# Patient Record
Sex: Male | Born: 1985 | Race: White | Hispanic: No | Marital: Single | Smoking: Former smoker
Health system: Southern US, Community
[De-identification: ages and names within clinical notes are randomized; demographics above are authoritative.]

## PROBLEM LIST (undated history)

## (undated) DIAGNOSIS — J45909 Unspecified asthma, uncomplicated: Secondary | ICD-10-CM

## (undated) DIAGNOSIS — S42302A Unspecified fracture of shaft of humerus, left arm, initial encounter for closed fracture: Secondary | ICD-10-CM

## (undated) HISTORY — DX: Unspecified fracture of shaft of humerus, left arm, initial encounter for closed fracture: S42.302A

---
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
## 2007-09-03 DIAGNOSIS — S42302A Unspecified fracture of shaft of humerus, left arm, initial encounter for closed fracture: Secondary | ICD-10-CM

## 2007-09-03 HISTORY — PX: OTHER SURGICAL HISTORY: SHX169

## 2007-09-03 HISTORY — DX: Unspecified fracture of shaft of humerus, left arm, initial encounter for closed fracture: S42.302A

## 2007-11-29 ENCOUNTER — Emergency Department: Payer: Self-pay | Admitting: Emergency Medicine

## 2008-06-12 ENCOUNTER — Emergency Department: Payer: Self-pay | Admitting: Emergency Medicine

## 2008-06-21 ENCOUNTER — Ambulatory Visit: Payer: Self-pay | Admitting: Orthopedic Surgery

## 2008-06-27 ENCOUNTER — Ambulatory Visit: Payer: Self-pay | Admitting: Orthopedic Surgery

## 2009-04-25 ENCOUNTER — Ambulatory Visit: Payer: Self-pay | Admitting: Orthopedic Surgery

## 2012-08-16 ENCOUNTER — Emergency Department: Payer: Self-pay | Admitting: Emergency Medicine

## 2014-03-06 IMAGING — CR DG CHEST 2V
1 series · 2 of 2 positions shown · non-contrast
Comparison: none

REASON FOR EXAM: productive cough
COMMENTS:

PROCEDURE:     DXR - DXR CHEST PA (OR AP) AND LATERAL  - August 16, 2012  [DATE]
RESULT:     Comparison: None

[Series 1: w chest pa · 0.14mm/px · 2 of 2 slices shown]
[im 1/2]
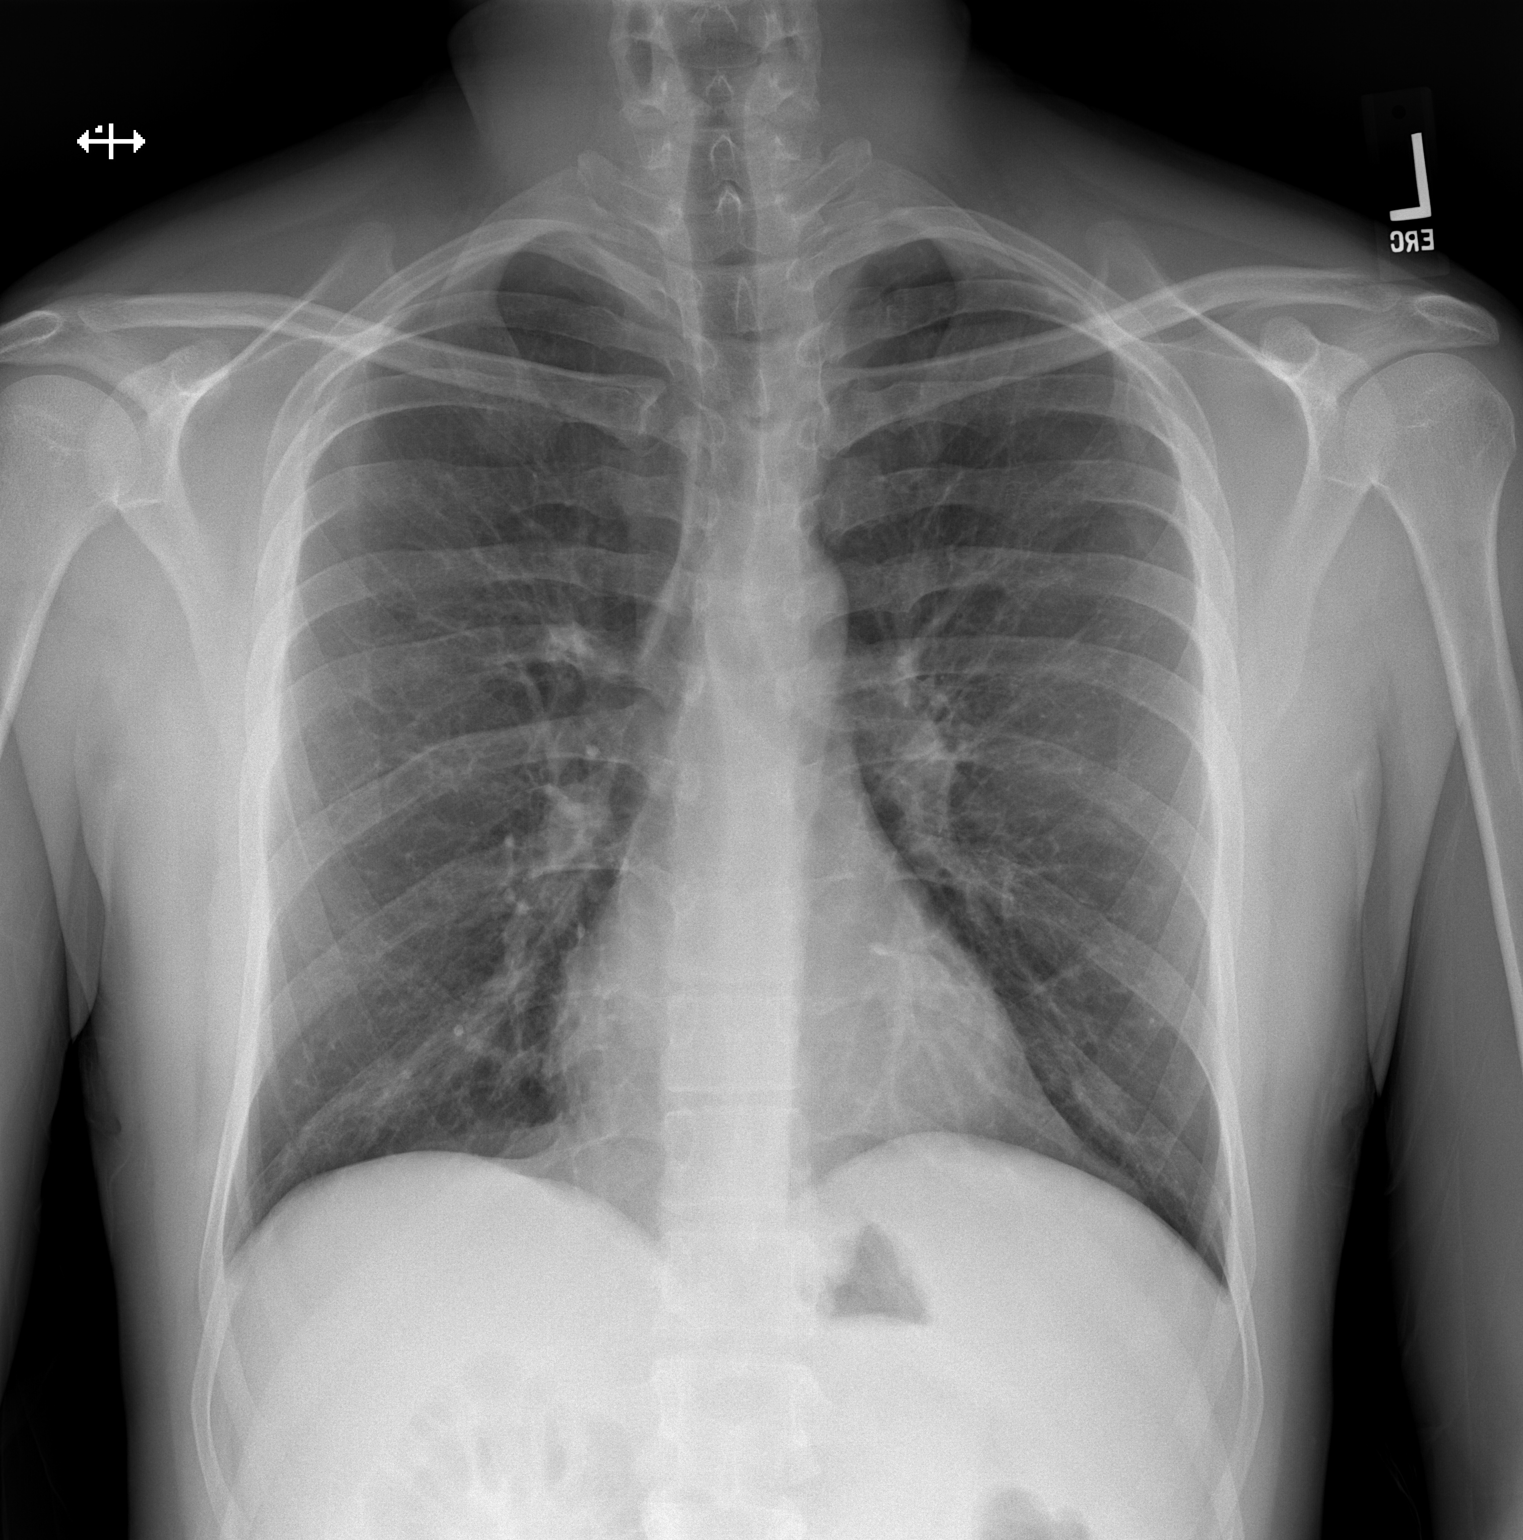
[im 2/2]
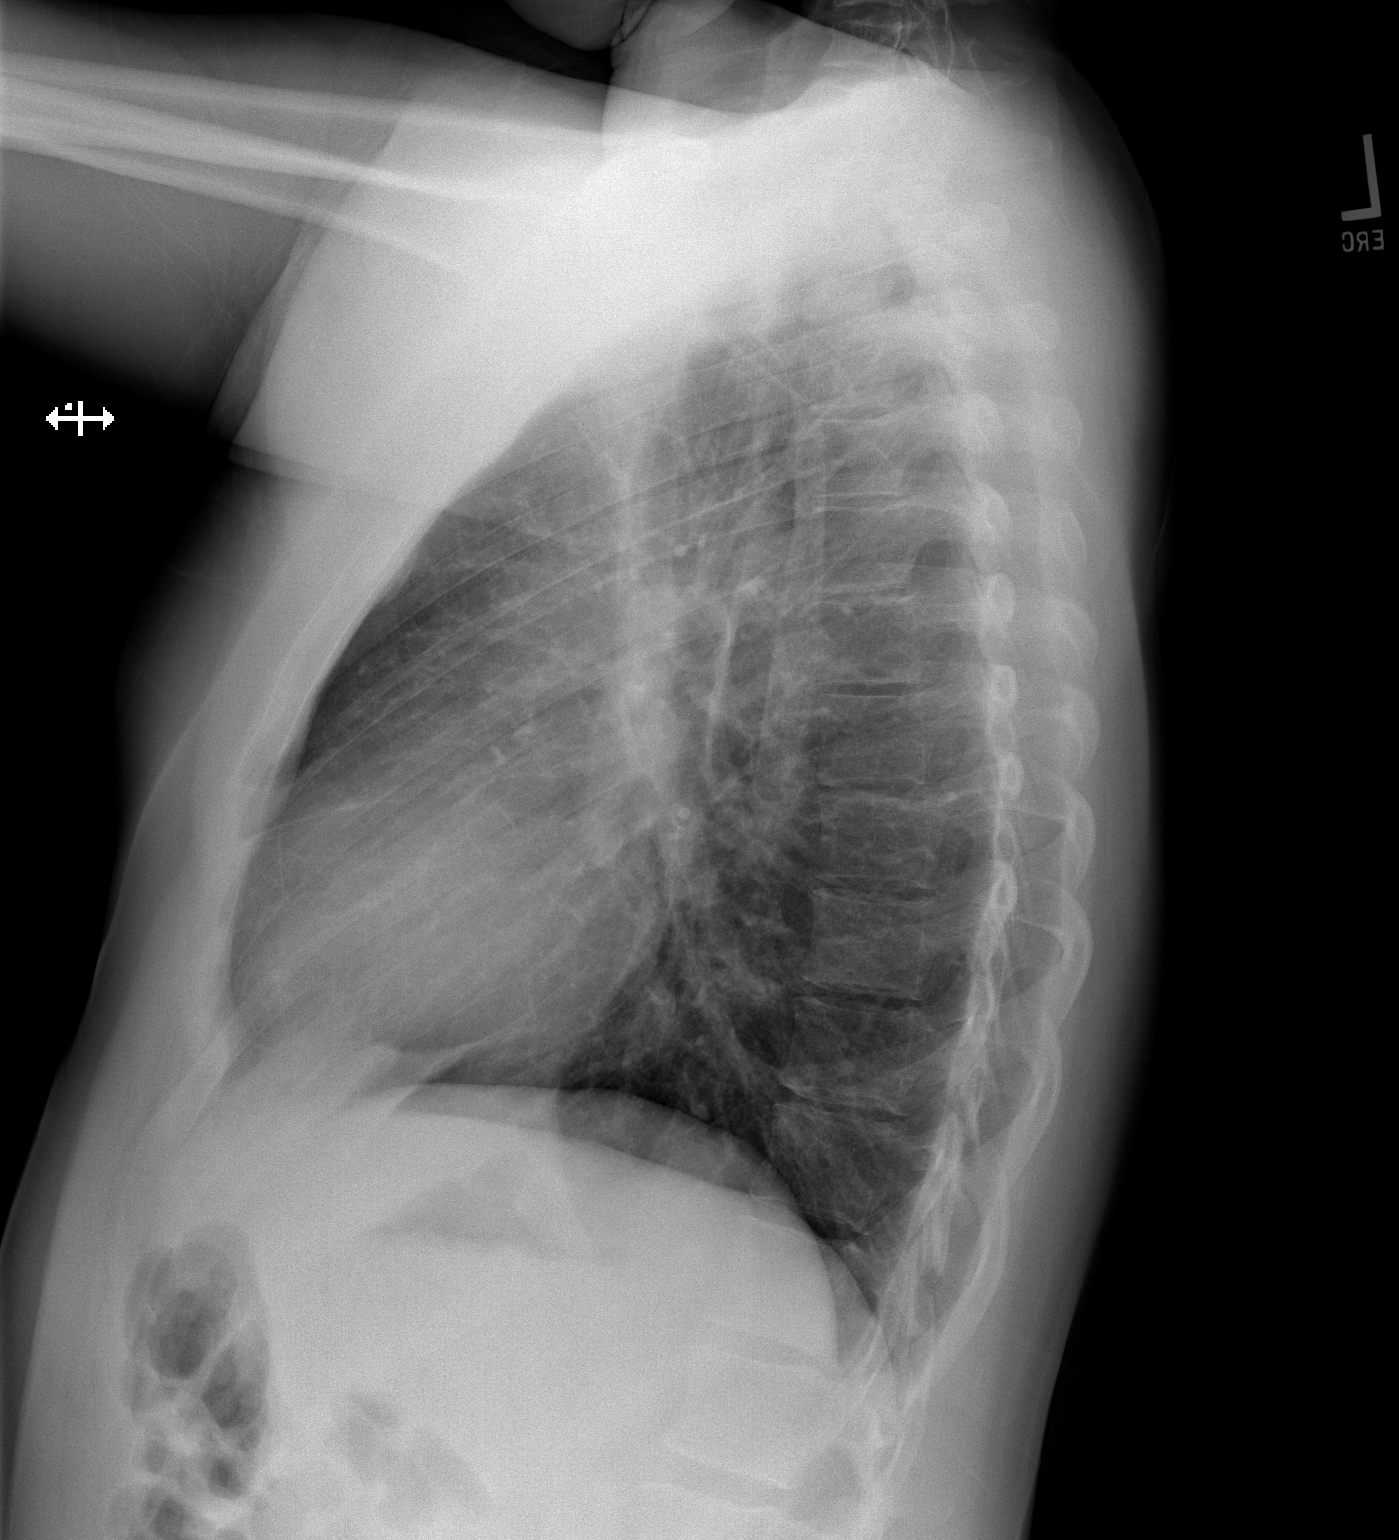

[2 of 2 positions shown; findings below may reference images not displayed]

FINDINGS: PA and lateral chest radiographs are provided.  There is no focal
parenchymal opacity, pleural effusion, or pneumothorax. The heart and
mediastinum are unremarkable.  The osseous structures are unremarkable.
IMPRESSION: No acute disease of the che[REDACTED]

## 2017-10-15 ENCOUNTER — Encounter: Payer: Self-pay | Admitting: Physician Assistant

## 2017-10-15 ENCOUNTER — Ambulatory Visit (INDEPENDENT_AMBULATORY_CARE_PROVIDER_SITE_OTHER): Payer: Commercial Managed Care - PPO | Admitting: Physician Assistant

## 2017-10-15 VITALS — BP 126/84 | HR 84 | Temp 97.8°F | Resp 16 | Ht 68.5 in | Wt 172.0 lb

## 2017-10-15 DIAGNOSIS — Z130 Encounter for screening for diseases of the blood and blood-forming organs and certain disorders involving the immune mechanism: Secondary | ICD-10-CM | POA: Diagnosis not present

## 2017-10-15 DIAGNOSIS — Z1329 Encounter for screening for other suspected endocrine disorder: Secondary | ICD-10-CM

## 2017-10-15 DIAGNOSIS — Z131 Encounter for screening for diabetes mellitus: Secondary | ICD-10-CM | POA: Diagnosis not present

## 2017-10-15 DIAGNOSIS — Z23 Encounter for immunization: Secondary | ICD-10-CM | POA: Diagnosis not present

## 2017-10-15 DIAGNOSIS — J45909 Unspecified asthma, uncomplicated: Secondary | ICD-10-CM

## 2017-10-15 DIAGNOSIS — Z13220 Encounter for screening for lipoid disorders: Secondary | ICD-10-CM

## 2017-10-15 DIAGNOSIS — J309 Allergic rhinitis, unspecified: Secondary | ICD-10-CM | POA: Insufficient documentation

## 2017-10-15 DIAGNOSIS — Z720 Tobacco use: Secondary | ICD-10-CM | POA: Diagnosis not present

## 2017-10-15 DIAGNOSIS — Z114 Encounter for screening for human immunodeficiency virus [HIV]: Secondary | ICD-10-CM | POA: Diagnosis not present

## 2017-10-15 MED ORDER — FLUTICASONE FUROATE-VILANTEROL 100-25 MCG/INH IN AEPB: 1.0000 | INHALATION_SPRAY | Freq: Every day | RESPIRATORY_TRACT | 1 refills | Status: DC

## 2017-10-15 MED ORDER — ALBUTEROL SULFATE HFA 108 (90 BASE) MCG/ACT IN AERS: 2.0000 | INHALATION_SPRAY | Freq: Four times a day (QID) | RESPIRATORY_TRACT | 6 refills | Status: DC | PRN

## 2017-10-15 NOTE — Progress Notes (Signed)
Patient: Kyle Obrien Male    DOB: 12-Sep-1985   32 y.o.   MRN: [id number] Visit Date: 10/15/2017  Today's Provider: Trey Sailors, PA-C   Chief Complaint  Patient presents with  . Establish Care   Subjective:    HPI   Kyle Obrien is a 31 y/o man presenting today to establish care, previously seen at [facility].Living in [place], by himself. Works full time as a Naval architect. Has 51 year old son with asthma.  He also has a history of asthma. Was previously on Qvar which did not help. Currently using rescue inhaler every other day. Currently smoking one pack per day, not interested in cessation aids.   No other health issues. Due for Tdap today.    Allergies  Allergen Reactions  . Penicillin G Rash    Pt states allergic reaction as infant     Current Outpatient Medications:  .  albuterol (PROAIR HFA) 108 (90 Base) MCG/ACT inhaler, Inhale 2 puffs into the lungs every 6 (six) hours as needed for wheezing or shortness of breath., Disp: 8 g, Rfl: 6 .  fluticasone furoate-vilanterol (BREO ELLIPTA) 100-25 MCG/INH AEPB, Inhale 1 puff into the lungs daily., Disp: 28 each, Rfl: 1  Review of Systems  Constitutional: Negative.   HENT: Negative.   Eyes: Negative.   Respiratory: Positive for cough and wheezing. Negative for apnea, choking, chest tightness, shortness of breath and stridor.   Cardiovascular: Negative.   Gastrointestinal: Negative.   Endocrine: Negative.   Genitourinary: Negative.   Musculoskeletal: Negative.   Skin: Negative.   Allergic/Immunologic: Negative.   Neurological: Negative.   Hematological: Negative.   Psychiatric/Behavioral: Negative.    Family History  Problem Relation Age of Onset  . Healthy Mother   . Healthy Father   . Healthy Sister    Past Surgical History:  Procedure Laterality Date  . fracture Left 2009     Social History   Tobacco Use  . Smoking status: Current Every Day Smoker    Types: Cigarettes  .  Smokeless tobacco: Never Used  Substance Use Topics  . Alcohol use: No    Frequency: Never   Objective:   BP 126/84 (BP Location: Right Arm, Patient Position: Sitting, Cuff Size: Normal)   Pulse 84   Temp 97.8 F (36.6 C) (Oral)   Resp 16   Ht 5' 8.5" (1.74 m)   Wt 172 lb (78 kg)   SpO2 97%   BMI 25.77 kg/m  Vitals:   10/15/17 1005  BP: 126/84  Pulse: 84  Resp: 16  Temp: 97.8 F (36.6 C)  TempSrc: Oral  SpO2: 97%  Weight: 172 lb (78 kg)  Height: 5' 8.5" (1.74 m)     Physical Exam  Constitutional: He is oriented to person, place, and time. He appears well-developed and well-nourished.  HENT:  Right Ear: External ear normal.  Left Ear: External ear normal.  Mouth/Throat: Oropharynx is clear and moist.  Eyes: Conjunctivae are normal.  Neck: Neck supple.  Cardiovascular: Normal rate and regular rhythm.  Pulmonary/Chest: Effort normal and breath sounds normal.  Abdominal: Soft. Bowel sounds are normal.  Lymphadenopathy:    He has no cervical adenopathy.  Neurological: He is alert and oriented to person, place, and time.  Skin: Skin is warm and dry.  Psychiatric: He has a normal mood and affect. His behavior is normal.        Assessment & Plan:     1.  Uncomplicated asthma, unspecified asthma severity, unspecified whether persistent  Using rescue inhaler every other day, not on maintenance inhaler. Qvar not helpful in the past. Will try breo.   - fluticasone furoate-vilanterol (BREO ELLIPTA) 100-25 MCG/INH AEPB; Inhale 1 puff into the lungs daily.  Dispense: 28 each; Refill: 1 - albuterol (PROAIR HFA) 108 (90 Base) MCG/ACT inhaler; Inhale 2 puffs into the lungs every 6 (six) hours as needed for wheezing or shortness of breath.  Dispense: 8 g; Refill: 6  2. Thyroid disorder screening  - TSH  3. Diabetes mellitus screening  - Comprehensive Metabolic Panel (CMET)  4. Screening cholesterol level  - Lipid Profile  5. Screening for deficiency anemia  - CBC  with Differential  6. Encounter for screening for HIV  - HIV antibody (with reflex)  7. Need for Tdap vaccination  - Tdap vaccine greater than or equal to 7yo IM  8. Tobacco abuse  Counseled 3-10 minutes on smoking cessation and how this worsens asthma. Patient declines cessation aids.  Return in about 1 year (around 10/15/2018) for cpe.  The entirety of the information documented in the History of Present Illness, Review of Systems and Physical Exam were personally obtained by me. Portions of this information were initially documented by Kavin LeechLaura Walsh, CMA and reviewed by me for thoroughness and accuracy.        Kyle SailorsAdriana M Ivana Nicastro, PA-C  [facility]

## 2017-10-15 NOTE — Patient Instructions (Signed)

## 2017-10-16 ENCOUNTER — Telehealth: Payer: Self-pay

## 2017-10-16 LAB — LIPID PANEL
Chol/HDL Ratio: 3.1 ratio (ref 0.0–5.0)
Cholesterol, Total: 124 mg/dL (ref 100–199)
HDL: 40 mg/dL (ref >39)
LDL Calculated: 48 mg/dL (ref 0–99)
Triglycerides: 182 mg/dL — ABNORMAL HIGH (ref 0–149)
VLDL Cholesterol Cal: 36 mg/dL (ref 5–40)

## 2017-10-16 LAB — COMPREHENSIVE METABOLIC PANEL
ALT: 55 IU/L — ABNORMAL HIGH (ref 0–44)
AST: 29 IU/L (ref 0–40)
Albumin/Globulin Ratio: 1.7 (ref 1.2–2.2)
Albumin: 4.5 g/dL (ref 3.5–5.5)
Alkaline Phosphatase: 129 IU/L — ABNORMAL HIGH (ref 39–117)
BUN/Creatinine Ratio: 10 (ref 9–20)
BUN: 10 mg/dL (ref 6–20)
Bilirubin Total: 0.5 mg/dL (ref 0.0–1.2)
CO2: 23 mmol/L (ref 20–29)
Calcium: 9.7 mg/dL (ref 8.7–10.2)
Chloride: 103 mmol/L (ref 96–106)
Creatinine, Ser: 1.02 mg/dL (ref 0.76–1.27)
GFR calc Af Amer: 113 mL/min/{1.73_m2} (ref >59)
GFR calc non Af Amer: 98 mL/min/{1.73_m2} (ref >59)
Globulin, Total: 2.7 g/dL (ref 1.5–4.5)
Glucose: 85 mg/dL (ref 65–99)
Potassium: 4.1 mmol/L (ref 3.5–5.2)
Sodium: 140 mmol/L (ref 134–144)
Total Protein: 7.2 g/dL (ref 6.0–8.5)

## 2017-10-16 LAB — CBC WITH DIFFERENTIAL/PLATELET
Basophils Absolute: 0 10*3/uL (ref 0.0–0.2)
Basos: 0 %
EOS (ABSOLUTE): 0.2 10*3/uL (ref 0.0–0.4)
Eos: 3 %
Hematocrit: 47.1 % (ref 37.5–51.0)
Hemoglobin: 16.5 g/dL (ref 13.0–17.7)
Immature Grans (Abs): 0 10*3/uL (ref 0.0–0.1)
Immature Granulocytes: 0 %
Lymphocytes Absolute: 2 10*3/uL (ref 0.7–3.1)
Lymphs: 32 %
MCH: 31.4 pg (ref 26.6–33.0)
MCHC: 35 g/dL (ref 31.5–35.7)
MCV: 90 fL (ref 79–97)
Monocytes Absolute: 0.4 10*3/uL (ref 0.1–0.9)
Monocytes: 7 %
Neutrophils Absolute: 3.6 10*3/uL (ref 1.4–7.0)
Neutrophils: 58 %
Platelets: 252 10*3/uL (ref 150–379)
RBC: 5.25 x10E6/uL (ref 4.14–5.80)
RDW: 13 % (ref 12.3–15.4)
WBC: 6.2 10*3/uL (ref 3.4–10.8)

## 2017-10-16 LAB — TSH: TSH: 1.17 u[IU]/mL (ref 0.450–4.500)

## 2017-10-16 LAB — HIV ANTIBODY (ROUTINE TESTING W REFLEX): HIV Screen 4th Generation wRfx: NONREACTIVE

## 2017-10-16 NOTE — Telephone Encounter (Signed)
-----   Message from Trey SailorsAdriana M Pollak, [place]-C sent at 10/16/2017  8:54 AM EST ----- Labwork normal except for one slightly elevated liver enzyme. Recommend reducing any alcohol or tylenol intake, will continue to monitor this.

## 2017-10-16 NOTE — Telephone Encounter (Signed)
Tried calling; Voicemail not set up.   Thanks,   -Vernona RiegerLaura

## 2017-10-17 NOTE — Telephone Encounter (Signed)
Pt advised.   Thanks,   -Tyreke Kaeser  

## 2018-11-02 ENCOUNTER — Other Ambulatory Visit: Payer: Self-pay | Admitting: Physician Assistant

## 2018-11-02 DIAGNOSIS — J45909 Unspecified asthma, uncomplicated: Secondary | ICD-10-CM

## 2018-11-02 MED ORDER — FLUTICASONE FUROATE-VILANTEROL 100-25 MCG/INH IN AEPB
1.0000 | INHALATION_SPRAY | Freq: Every day | RESPIRATORY_TRACT | 0 refills | Status: DC
Start: 2018-11-02 — End: 2019-05-31

## 2018-11-02 MED ORDER — ALBUTEROL SULFATE HFA 108 (90 BASE) MCG/ACT IN AERS: 2.0000 | INHALATION_SPRAY | Freq: Four times a day (QID) | RESPIRATORY_TRACT | 6 refills | Status: DC | PRN

## 2018-11-02 NOTE — Telephone Encounter (Signed)
Pt needs refill on his   Albuterol inhaler  [facility] [address]  Thanks [employer]

## 2018-11-02 NOTE — Telephone Encounter (Signed)
Has not been seen since 10/2017

## 2018-11-02 NOTE — Telephone Encounter (Signed)
Apt made for 11/20/2018.  Pt stated he needed Pro air sent to [facility].  He was okay with Breo.   Thanks,   -Vernona Rieger

## 2018-11-02 NOTE — Telephone Encounter (Signed)
Please schedule for CPE/follow up, hasn't been seen > 1 year. Did refill breo x 1 month. Thank you.

## 2018-11-20 ENCOUNTER — Encounter: Payer: Self-pay | Admitting: Physician Assistant

## 2018-11-20 ENCOUNTER — Other Ambulatory Visit: Payer: Self-pay

## 2018-11-20 ENCOUNTER — Ambulatory Visit (INDEPENDENT_AMBULATORY_CARE_PROVIDER_SITE_OTHER): Payer: Commercial Managed Care - PPO | Admitting: Physician Assistant

## 2018-11-20 VITALS — BP 112/76 | HR 87 | Temp 98.2°F | Resp 16 | Ht 68.0 in | Wt 177.0 lb

## 2018-11-20 DIAGNOSIS — J45909 Unspecified asthma, uncomplicated: Secondary | ICD-10-CM | POA: Diagnosis not present

## 2018-11-20 DIAGNOSIS — Z0000 Encounter for general adult medical examination without abnormal findings: Secondary | ICD-10-CM | POA: Diagnosis not present

## 2018-11-20 DIAGNOSIS — R748 Abnormal levels of other serum enzymes: Secondary | ICD-10-CM

## 2018-11-20 DIAGNOSIS — Z720 Tobacco use: Secondary | ICD-10-CM | POA: Diagnosis not present

## 2018-11-20 NOTE — Progress Notes (Signed)
Patient: Kyle Obrien, Male    DOB: 1986/07/25, 33 y.o.   MRN: [id number] Visit Date: 11/20/2018  Today's Provider: Trey Sailors, PA-C   Chief Complaint  Patient presents with  . Annual Exam   Subjective:     Annual physical exam Kyle Obrien is a 33 y.o. male who presents today for health maintenance and complete physical. He feels well. 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 He reports he is sleeping fairly well.  Second child due in July. Continues to drive trucks, delivers groceries.   He continue to smoke up to 2 packs per day. Reports he is not interested in quitting.   He has a history of asthma. Was placed on Breo. Said he used this daily for two months and it did not help so he stopped. No he only uses rescue albuterol inhaler. May use this once daily for a span of several days, may go for weeks without using it. Does not have symptoms that awaken him at night.   Wt Readings from Last 3 Encounters:  11/20/18 177 lb (80.3 kg)  10/15/17 172 lb (78 kg)   One elevated liver enzyme. Drinks less than once/month. Does not use tylenol.   No history of colon cancer or prostate cancer.  ----------------------------------------------------------------- Declines Influenza Vaccine  Review of Systems  Constitutional: Negative.   HENT: Negative.   Eyes: Negative.   Respiratory: Positive for cough, shortness of breath and wheezing.        Chronic conditions ( Asthma)  Cardiovascular: Negative.   Gastrointestinal: Negative.   Endocrine: Negative.   Genitourinary: Negative.   Musculoskeletal: Negative.   Skin: Negative.   Allergic/Immunologic: Negative.   Neurological: Negative.   Hematological: Negative.   Psychiatric/Behavioral: Negative.     Social History      He  reports that he has been smoking cigarettes. He has never used smokeless tobacco. He reports that he does not drink alcohol or use drugs.       Social History   Socioeconomic History    . Marital status: Single    Spouse name: Not on file  . Number of children: Not on file  . Years of education: Not on file  . Highest education level: Not on file  Occupational History  . Not on file  Social Needs  . Financial resource strain: Not on file  . Food insecurity:    Worry: Not on file    Inability: Not on file  . Transportation needs:    Medical: Not on file    Non-medical: Not on file  Tobacco Use  . Smoking status: Current Every Day Smoker    Types: Cigarettes  . Smokeless tobacco: Never Used  Substance and Sexual Activity  . Alcohol use: No    Frequency: Never  . Drug use: No  . Sexual activity: Not on file  Lifestyle  . Physical activity:    Days per week: Not on file    Minutes per session: Not on file  . Stress: Not on file  Relationships  . Social connections:    Talks on phone: Not on file    Gets together: Not on file    Attends religious service: Not on file    Active member of club or organization: Not on file    Attends meetings of clubs or organizations: Not on file    Relationship status: Not on file  Other Topics Concern  . Not on file  Social History Narrative  . Not on file    Past Medical History:  Diagnosis Date  . Arm fracture, left 2009     Patient Active Problem List   Diagnosis Date Noted  . Asthma 10/15/2017  . Allergic rhinitis 10/15/2017  . Tobacco use 10/15/2017  . Uncomplicated asthma 10/15/2017    Past Surgical History:  Procedure Laterality Date  . fracture Left 2009    Family History        Family Status  Relation Name Status  . Mother  Alive  . Father  Alive  . Sister  Alive        His family history includes Healthy in his father, mother, and sister.      Allergies  Allergen Reactions  . Penicillin G Rash    Pt states allergic reaction as infant     Current Outpatient Medications:  .  albuterol (PROAIR HFA) 108 (90 Base) MCG/ACT inhaler, Inhale 2 puffs into the lungs every 6 (six) hours as  needed for wheezing or shortness of breath., Disp: 8 g, Rfl: 6 .  fluticasone furoate-vilanterol (BREO ELLIPTA) 100-25 MCG/INH AEPB, Inhale 1 puff into the lungs daily. (Patient not taking: Reported on 11/20/2018), Disp: 28 each, Rfl: 0   Patient Care Team: Maryella Shivers as PCP - General (Physician Assistant)    Objective:    Vitals: BP 112/76 (BP Location: Right Arm, Patient Position: Sitting, Cuff Size: Large)   Pulse 87   Temp 98.2 F (36.8 C) (Oral)   Resp 16   Ht 5\' 8"  (1.727 m)   Wt 177 lb (80.3 kg)   BMI 26.91 kg/m    Vitals:   11/20/18 1542  BP: 112/76  Pulse: 87  Resp: 16  Temp: 98.2 F (36.8 C)  TempSrc: Oral  Weight: 177 lb (80.3 kg)  Height: 5\' 8"  (1.727 m)     Physical Exam Constitutional:      Appearance: Normal appearance.  HENT:     Head: Normocephalic and atraumatic.     Right Ear: Tympanic membrane, ear canal and external ear normal.     Left Ear: Tympanic membrane, ear canal and external ear normal.     Nose: Nose normal.     Mouth/Throat:     Mouth: Mucous membranes are moist.     Pharynx: Oropharynx is clear.  Eyes:     Extraocular Movements: Extraocular movements intact.     Conjunctiva/sclera: Conjunctivae normal.     Pupils: Pupils are equal, round, and reactive to light.  Neck:     Musculoskeletal: Normal range of motion and neck supple.  Cardiovascular:     Rate and Rhythm: Normal rate and regular rhythm.     Pulses: Normal pulses.     Heart sounds: Normal heart sounds.  Abdominal:     General: Bowel sounds are normal.  Musculoskeletal: Normal range of motion.  Skin:    General: Skin is warm.  Neurological:     Mental Status: He is alert and oriented to person, place, and time.  Psychiatric:        Mood and Affect: Mood normal.        Behavior: Behavior normal.        Thought Content: Thought content normal.        Judgment: Judgment normal.      Depression Screen PHQ 2/9 Scores 11/20/2018 10/15/2017  PHQ - 2 Score  0 0  PHQ- 9 Score - 1  Assessment & Plan:     Routine Health Maintenance and Physical Exam  Exercise Activities and Dietary recommendations Goals   None     Immunization History  Administered Date(s) Administered  . Hepatitis B 06/04/1999, 07/09/1999, 12/03/1999  . Tdap 10/15/2017    Health Maintenance  Topic Date Due  . INFLUENZA VACCINE  04/02/2018  . TETANUS/TDAP  10/16/2027  . HIV Screening  Completed     Discussed health benefits of physical activity, and encouraged him to engage in regular exercise appropriate for his age and condition.    1. Annual physical exam   2. Persistent asthma without complication, unspecified asthma severity   3. Tobacco use  Counseled he needs to stop smoking. He is not interested in quitting at this time.   The entirety of the information documented in the History of Present Illness, Review of Systems and Physical Exam were personally obtained by me. Portions of this information were initially documented by joseline Rosas, CMA and reviewed by me for thoroughness and accuracy.    F/u 1 year CPE   --------------------------------------------------------------------    Trey Sailors, PA-C  [facility]

## 2018-11-20 NOTE — Patient Instructions (Signed)

## 2018-11-21 LAB — COMPREHENSIVE METABOLIC PANEL
ALT: 48 IU/L — ABNORMAL HIGH (ref 0–44)
AST: 27 IU/L (ref 0–40)
Albumin/Globulin Ratio: 1.9 (ref 1.2–2.2)
Albumin: 4.6 g/dL (ref 4.0–5.0)
Alkaline Phosphatase: 122 IU/L — ABNORMAL HIGH (ref 39–117)
BUN/Creatinine Ratio: 13 (ref 9–20)
BUN: 11 mg/dL (ref 6–20)
Bilirubin Total: 0.8 mg/dL (ref 0.0–1.2)
CO2: 22 mmol/L (ref 20–29)
Calcium: 9.9 mg/dL (ref 8.7–10.2)
Chloride: 102 mmol/L (ref 96–106)
Creatinine, Ser: 0.84 mg/dL (ref 0.76–1.27)
GFR calc Af Amer: 135 mL/min/{1.73_m2} (ref >59)
GFR calc non Af Amer: 117 mL/min/{1.73_m2} (ref >59)
Globulin, Total: 2.4 g/dL (ref 1.5–4.5)
Glucose: 97 mg/dL (ref 65–99)
Potassium: 4.5 mmol/L (ref 3.5–5.2)
Sodium: 139 mmol/L (ref 134–144)
Total Protein: 7 g/dL (ref 6.0–8.5)

## 2018-11-23 ENCOUNTER — Telehealth: Payer: Self-pay

## 2018-11-23 NOTE — Telephone Encounter (Signed)
-----   Message from Trey Sailors, [place] sent at 11/21/2018  9:44 AM EDT ----- Liver enzyme slightly elevated but improved since last visit. Continue to monitor.

## 2018-11-23 NOTE — Telephone Encounter (Signed)
Patient advised.KW 

## 2019-05-31 ENCOUNTER — Telehealth: Payer: Self-pay | Admitting: Physician Assistant

## 2019-05-31 DIAGNOSIS — J45909 Unspecified asthma, uncomplicated: Secondary | ICD-10-CM

## 2019-05-31 MED ORDER — FLUTICASONE FUROATE-VILANTEROL 100-25 MCG/INH IN AEPB: 1.0000 | INHALATION_SPRAY | Freq: Every day | RESPIRATORY_TRACT | 0 refills | Status: DC

## 2019-05-31 MED ORDER — ALBUTEROL SULFATE HFA 108 (90 BASE) MCG/ACT IN AERS: 2.0000 | INHALATION_SPRAY | Freq: Four times a day (QID) | RESPIRATORY_TRACT | 6 refills | Status: DC | PRN

## 2019-05-31 NOTE — Telephone Encounter (Signed)
Pt needs refill on albuterol and breo.  [place]

## 2019-08-02 ENCOUNTER — Other Ambulatory Visit: Payer: Self-pay

## 2019-08-02 DIAGNOSIS — Z20822 Contact with and (suspected) exposure to covid-19: Secondary | ICD-10-CM

## 2019-08-03 LAB — NOVEL CORONAVIRUS, NAA: SARS-CoV-2, NAA: NOT DETECTED

## 2019-08-25 ENCOUNTER — Other Ambulatory Visit: Payer: Self-pay | Admitting: Physician Assistant

## 2019-08-25 DIAGNOSIS — J45909 Unspecified asthma, uncomplicated: Secondary | ICD-10-CM

## 2019-11-08 ENCOUNTER — Other Ambulatory Visit: Payer: Self-pay | Admitting: Physician Assistant

## 2019-11-08 DIAGNOSIS — J45909 Unspecified asthma, uncomplicated: Secondary | ICD-10-CM

## 2019-11-08 MED ORDER — ALBUTEROL SULFATE HFA 108 (90 BASE) MCG/ACT IN AERS: 2.0000 | INHALATION_SPRAY | Freq: Four times a day (QID) | RESPIRATORY_TRACT | 6 refills | Status: DC | PRN

## 2019-11-08 NOTE — Telephone Encounter (Signed)
Requested Prescriptions  Pending Prescriptions Disp Refills  . albuterol (PROAIR HFA) 108 (90 Base) MCG/ACT inhaler 8 g 6    Sig: Inhale 2 puffs into the lungs every 6 (six) hours as needed for wheezing or shortness of breath.     Pulmonology:  Beta Agonists Failed - 11/08/2019  1:50 PM      Failed - One inhaler should last at least one month. If the patient is requesting refills earlier, contact the patient to check for uncontrolled symptoms.      Passed - Valid encounter within last 12 months    Recent Outpatient Visits          11 months ago Annual physical exam   [facility] [place]do Angst M, [place]   2 years ago Uncomplicated asthma, unspecified asthma severity, unspecified whether persistent   [facility] [place], [place], [place]

## 2019-11-08 NOTE — Telephone Encounter (Signed)
Copied from CRM [id number]. Topic: Quick Communication - Rx Refill/Question >> Nov 08, 2019  1:45 PM Jaquita Rector A wrote: Medication: albuterol (PROAIR HFA) 108 (90 Base) MCG/ACT inhaler   Has the patient contacted their pharmacy? Yes.   (Agent: If no, request that the patient contact the pharmacy for the refill.) (Agent: If yes, when and what did the pharmacy advise?)  Preferred Pharmacy (with phone number or street name): [facility] [place], [place] - [address]  Phone:  [phone] Fax:  [phone]     Agent: Please be advised that RX refills may take up to 3 business days. We ask that you follow-up with your pharmacy.

## 2019-11-26 ENCOUNTER — Encounter: Payer: Commercial Managed Care - PPO | Admitting: Physician Assistant

## 2019-11-30 ENCOUNTER — Encounter: Payer: Commercial Managed Care - PPO | Admitting: Physician Assistant

## 2020-01-18 ENCOUNTER — Other Ambulatory Visit: Payer: Self-pay

## 2020-01-18 ENCOUNTER — Emergency Department: Payer: Self-pay

## 2020-01-18 ENCOUNTER — Emergency Department
Admission: EM | Admit: 2020-01-18 | Discharge: 2020-01-18 | Disposition: A | Discharge status: Home / Self Care | Payer: Self-pay | Attending: Student | Admitting: Student

## 2020-01-18 ENCOUNTER — Encounter: Payer: Self-pay | Admitting: Emergency Medicine

## 2020-01-18 DIAGNOSIS — F17210 Nicotine dependence, cigarettes, uncomplicated: Secondary | ICD-10-CM | POA: Insufficient documentation

## 2020-01-18 DIAGNOSIS — J45901 Unspecified asthma with (acute) exacerbation: Secondary | ICD-10-CM | POA: Insufficient documentation

## 2020-01-18 DIAGNOSIS — Z20822 Contact with and (suspected) exposure to covid-19: Secondary | ICD-10-CM | POA: Insufficient documentation

## 2020-01-18 DIAGNOSIS — Z79899 Other long term (current) drug therapy: Secondary | ICD-10-CM | POA: Insufficient documentation

## 2020-01-18 HISTORY — DX: Unspecified asthma, uncomplicated: J45.909

## 2020-01-18 LAB — CBC WITH DIFFERENTIAL/PLATELET
Abs Immature Granulocytes: 0.03 10*3/uL (ref 0.00–0.07)
Basophils Absolute: 0.1 10*3/uL (ref 0.0–0.1)
Basophils Relative: 1 %
Eosinophils Absolute: 0.7 10*3/uL — ABNORMAL HIGH (ref 0.0–0.5)
Eosinophils Relative: 7 %
HCT: 47.8 % (ref 39.0–52.0)
Hemoglobin: 16.6 g/dL (ref 13.0–17.0)
Immature Granulocytes: 0 %
Lymphocytes Relative: 27 %
Lymphs Abs: 2.8 10*3/uL (ref 0.7–4.0)
MCH: 30.6 pg (ref 26.0–34.0)
MCHC: 34.7 g/dL (ref 30.0–36.0)
MCV: 88 fL (ref 80.0–100.0)
Monocytes Absolute: 0.8 10*3/uL (ref 0.1–1.0)
Monocytes Relative: 7 %
Neutro Abs: 6.2 10*3/uL (ref 1.7–7.7)
Neutrophils Relative %: 58 %
Platelets: 293 10*3/uL (ref 150–400)
RBC: 5.43 MIL/uL (ref 4.22–5.81)
RDW: 12.3 % (ref 11.5–15.5)
WBC: 10.5 10*3/uL (ref 4.0–10.5)
nRBC: 0 % (ref 0.0–0.2)

## 2020-01-18 LAB — COMPREHENSIVE METABOLIC PANEL
ALT: 75 U/L — ABNORMAL HIGH (ref 0–44)
AST: 38 U/L (ref 15–41)
Albumin: 4.3 g/dL (ref 3.5–5.0)
Alkaline Phosphatase: 116 U/L (ref 38–126)
Anion gap: 9 (ref 5–15)
BUN: 16 mg/dL (ref 6–20)
CO2: 26 mmol/L (ref 22–32)
Calcium: 9.5 mg/dL (ref 8.9–10.3)
Chloride: 105 mmol/L (ref 98–111)
Creatinine, Ser: 1.06 mg/dL (ref 0.61–1.24)
GFR calc Af Amer: >60 mL/min (ref >60)
GFR calc non Af Amer: >60 mL/min (ref >60)
Glucose, Bld: 100 mg/dL — ABNORMAL HIGH (ref 70–99)
Potassium: 3.7 mmol/L (ref 3.5–5.1)
Sodium: 140 mmol/L (ref 135–145)
Total Bilirubin: 1.2 mg/dL (ref 0.3–1.2)
Total Protein: 7.7 g/dL (ref 6.5–8.1)

## 2020-01-18 LAB — FIBRIN DERIVATIVES D-DIMER (ARMC ONLY): Fibrin derivatives D-dimer (ARMC): 174.04 ng/mL (FEU) (ref 0.00–499.00)

## 2020-01-18 LAB — TROPONIN I (HIGH SENSITIVITY): Troponin I (High Sensitivity): 3 ng/L (ref <18)

## 2020-01-18 LAB — SARS CORONAVIRUS 2 BY RT PCR (HOSPITAL ORDER, PERFORMED IN ~~LOC~~ HOSPITAL LAB): SARS Coronavirus 2: NEGATIVE

## 2020-01-18 MED ORDER — PREDNISONE 20 MG PO TABS
60.0000 mg | ORAL_TABLET | Freq: Once | ORAL | Status: AC
  Administered 2020-01-18: 60 mg via ORAL
  Filled 2020-01-18: qty 3

## 2020-01-18 MED ORDER — IPRATROPIUM-ALBUTEROL 0.5-2.5 (3) MG/3ML IN SOLN
3.0000 mL | Freq: Once | RESPIRATORY_TRACT | Status: AC
  Administered 2020-01-18: 3 mL via RESPIRATORY_TRACT
  Filled 2020-01-18: qty 3

## 2020-01-18 MED ORDER — AZITHROMYCIN 250 MG PO TABS: 250.0000 mg | ORAL_TABLET | Freq: Every day | ORAL | 0 refills | Status: AC

## 2020-01-18 MED ORDER — ALBUTEROL SULFATE (2.5 MG/3ML) 0.083% IN NEBU
2.5000 mg | INHALATION_SOLUTION | Freq: Four times a day (QID) | RESPIRATORY_TRACT | 2 refills | Status: DC | PRN
Start: 2020-01-18 — End: 2021-06-15

## 2020-01-18 MED ORDER — AZITHROMYCIN 500 MG PO TABS
500.0000 mg | ORAL_TABLET | Freq: Once | ORAL | Status: AC
  Administered 2020-01-18: 500 mg via ORAL
  Filled 2020-01-18: qty 1

## 2020-01-18 MED ORDER — PREDNISONE 50 MG PO TABS: 50.0000 mg | ORAL_TABLET | Freq: Every day | ORAL | 0 refills | Status: AC

## 2020-01-18 NOTE — ED Triage Notes (Signed)
Patient ambulatory to triage with steady gait, without difficulty or distress noted, mask in place; pt reports prod cough brown-yellow sputum x 2 days; denies fever but is c/o left sided CP

## 2020-01-18 NOTE — Discharge Instructions (Addendum)
Thank you for letting us take care of you in the emergency department today.   Please continue to take any regular, prescribed medications. Please use your albuterol inhaler, 2-4 puffs every 6 hours, scheduled for the next 3 days. If you are able to obtain a nebulizer machine, use the nebulizer machine every 6 hours, scheduled, for the next 3 days in place of the inhaler.  New medications we have prescribed:  Albuterol nebulizer solution + nebulizer machine prescription Steroids Antibiotics  Please follow up with: Your primary care doctor to review your ER visit and follow up on your symptoms.    Please return to the ER for any new or worsening symptoms.

## 2020-01-18 NOTE — ED Provider Notes (Signed)
[facility] Emergency Department Provider Note  ____________________________________________   First MD Initiated Contact with Patient 01/18/20 2028     (approximate)  I have reviewed the triage vital signs and the nursing notes.  History  Chief Complaint Chest Pain and Cough    HPI Kyle Obrien is a 34 y.o. male with history of asthma, tobacco use s/p recently quit smoking, who presents to the emergency department for cough, wheezing, and chest pain.  Patient states symptoms have been ongoing for the last several days, constant since onset, progressively worsening.  Cough is productive of a brownish-yellow sputum.  No fevers.  Reports associated chest pain with coughing, primarily left-sided.  Worsened with coughing and deep inspiration. Describes it a sharp, shoot, pressure like. 4/10 in severity, no radiation. No vomiting or diarrhea.  Has been using his albuterol inhaler without significant improvement.  He is also prescribed a Breo inhaler, but has been unable to afford this due to insurance issues.  No sick contacts. No known COVID exposure. Has not been vaccinated.    Past Medical Hx Past Medical History:  Diagnosis Date  . Arm fracture, left 2009  . Asthma     Problem List Patient Active Problem List   Diagnosis Date Noted  . Asthma 10/15/2017  . Allergic rhinitis 10/15/2017  . Tobacco use 10/15/2017  . Uncomplicated asthma 16/06/9603    Past Surgical Hx Past Surgical History:  Procedure Laterality Date  . fracture Left 2009    Medications Prior to Admission medications   Medication Sig Start Date End Date Taking? Authorizing Provider  albuterol (PROAIR HFA) 108 (90 Base) MCG/ACT inhaler Inhale 2 puffs into the lungs every 6 (six) hours as needed for wheezing or shortness of breath. 11/08/19   Trinna Post, PA-C  BREO ELLIPTA 100-25 MCG/INH AEPB Inhale 1 puff by mouth once daily 08/25/19   Trinna Post, PA-C    Allergies  Penicillin g  Family Hx Family History  Problem Relation Age of Onset  . Healthy Mother   . Healthy Father   . Healthy Sister     Social Hx Social History   Tobacco Use  . Smoking status: Current Every Day Smoker    Types: Cigarettes  . Smokeless tobacco: Never Used  Substance Use Topics  . Alcohol use: No  . Drug use: No     Review of Systems  Constitutional: Negative for fever. Negative for chills. Eyes: Negative for visual changes. ENT: Negative for sore throat. Cardiovascular: + for chest pain. Respiratory: + cough Gastrointestinal: Negative for nausea. Negative for vomiting.  Genitourinary: Negative for dysuria. Musculoskeletal: Negative for leg swelling. Skin: Negative for rash. Neurological: Negative for headaches.   Physical Exam  Vital Signs: ED Triage Vitals  Enc Vitals Group     BP 01/18/20 1920 126/80     Pulse Rate 01/18/20 1920 (!) 110     Resp 01/18/20 1920 20     Temp 01/18/20 1920 98.2 F (36.8 C)     Temp Source 01/18/20 1920 Oral     SpO2 01/18/20 1920 93 %     Weight 01/18/20 1920 170 lb (77.1 kg)     Height 01/18/20 1920 5\' 8"  (1.727 m)     Head Circumference --      Peak Flow --      Pain Score 01/18/20 1922 4     Pain Loc --      Pain Edu? --      Excl.  in GC? --     Constitutional: Alert and oriented. NAD.  Head: Normocephalic. Atraumatic. Eyes: Conjunctivae clear. Sclera anicteric. Pupils equal and symmetric. Nose: No masses or lesions. No congestion or rhinorrhea. Mouth/Throat: Wearing mask.  Neck: No stridor. Trachea midline.  Cardiovascular: Tachycardic, regular rhythm. Extremities well perfused. Respiratory: Normal respiratory effort.  Coarse lung sounds and expiratory wheezing bilaterally.  Frequent dry cough. Gastrointestinal: Soft. Non-distended. Non-tender.  Genitourinary: Deferred. Musculoskeletal: No lower extremity edema. No deformities. Neurologic:  Normal speech and language. No gross focal or lateralizing  neurologic deficits are appreciated.  Skin: Skin is warm, dry and intact. No rash noted. Psychiatric: Mood and affect are appropriate for situation.  EKG  Personally reviewed and interpreted by myself.   Date: 01/18/20 Time: 1928 Rate: 101 Rhythm: sinus Axis: normal Intervals: WNL Sinus tachycardia, otherwise no acute ischemia or arrhythmia No STEMI    Radiology  Personally reviewed available imaging myself.   CXR - IMPRESSION:  Chronic central bronchial thickening. Minor subsegmental atelectasis  in the lingula.    Procedures  Procedure(s) performed (including critical care):  .Critical Care Performed by: Miguel Aschoff., MD Authorized by: Miguel Aschoff., MD   Critical care provider statement:    Critical care time (minutes):  30   Critical care was necessary to treat or prevent imminent or life-threatening deterioration of the following conditions:  Respiratory failure   Critical care was time spent personally by me on the following activities:  Discussions with consultants, evaluation of patient's response to treatment, examination of patient, ordering and performing treatments and interventions, ordering and review of laboratory studies, ordering and review of radiographic studies, pulse oximetry, re-evaluation of patient's condition, obtaining history from patient or surrogate and review of old charts     Initial Impression / Assessment and Plan / MDM / ED Course  34 y.o. male with history of asthma who presents to the ED for cough, wheezing, chest pain.  Exam seems most consistent with asthma exacerbation.  Also consider underlying infectious precipitant, such as bronchitis, pneumonia, viral infection, COVID.  Suspect his chest pain is related to his frequent coughing, but also consider atypical ACS or PE given pleuritic component.  We will plan for labs, nebulizer treatments, as well as steroids/azithromycin for treatment of asthma exacerbation and reassess.   Patient has completed nebulizer treatments x 3, he still has some mild wheezing, but this is markedly improved from his initial exam.  He reports improvement in his symptoms as well.  CXR with chronic central bronchial thickening -  although he is young, consider component of chronic bronchitis/COPD given his heavy tobacco use history.  Troponin negative, D-dimer negative, COVID negative.  Updated patient on results.  Did offer him observation admission given he still has some residual wheezing, however at this time patient is especially financially constricted and would prefer trial of outpatient treatment.  Given his wheezing has improved with nebulizers and his work-up is otherwise unremarkable, feel this is reasonable.  Will plan to prescribe course of steroids, antibiotics, nebulizer, and advised scheduled nebulizer treatments for the next 3 days.  Discussed strict return precautions and need for outpatient follow-up.  He voices understanding and is comfortable with this plan.  _______________________________   As part of my medical decision making I have reviewed available labs, radiology tests, reviewed old records/performed chart review.    Final Clinical Impression(s) / ED Diagnosis  Final diagnoses:  Asthma with acute exacerbation, unspecified asthma severity, unspecified whether persistent  Note:  This document was prepared using Dragon voice recognition software and may include unintentional dictation errors.   Miguel Aschoff., MD 01/18/20 2230

## 2020-06-09 ENCOUNTER — Encounter: Payer: Self-pay | Admitting: Physician Assistant

## 2020-06-09 ENCOUNTER — Ambulatory Visit (INDEPENDENT_AMBULATORY_CARE_PROVIDER_SITE_OTHER): Payer: Commercial Managed Care - PPO | Admitting: Physician Assistant

## 2020-06-09 ENCOUNTER — Other Ambulatory Visit: Payer: Self-pay

## 2020-06-09 VITALS — BP 127/88 | HR 115 | Temp 98.4°F | Resp 16 | Ht 68.5 in | Wt 183.8 lb

## 2020-06-09 DIAGNOSIS — Z720 Tobacco use: Secondary | ICD-10-CM | POA: Diagnosis not present

## 2020-06-09 DIAGNOSIS — J45909 Unspecified asthma, uncomplicated: Secondary | ICD-10-CM | POA: Diagnosis not present

## 2020-06-09 DIAGNOSIS — Z0000 Encounter for general adult medical examination without abnormal findings: Secondary | ICD-10-CM

## 2020-06-09 MED ORDER — BREO ELLIPTA 100-25 MCG/INH IN AEPB: INHALATION_SPRAY | RESPIRATORY_TRACT | 2 refills | Status: DC

## 2020-06-09 MED ORDER — ALBUTEROL SULFATE HFA 108 (90 BASE) MCG/ACT IN AERS: 2.0000 | INHALATION_SPRAY | Freq: Four times a day (QID) | RESPIRATORY_TRACT | 2 refills | Status: DC | PRN

## 2020-06-09 NOTE — Patient Instructions (Signed)

## 2020-06-09 NOTE — Progress Notes (Signed)
Complete physical exam   Patient: Kyle Obrien   DOB: December 16, 1985   34 y.o. Male  MRN: [id number] Visit Date: 06/09/2020  Today's healthcare provider: Trey Sailors, PA-C   Chief Complaint  Patient presents with  . Annual Exam   Subjective    Kyle Obrien is a 34 y.o. male who presents today for a complete physical exam.  He reports consuming a general diet. The patient does not participate in regular exercise at present. He generally feels fairly well. He reports sleeping poorly. He does not have additional problems to discuss today.   Asthma: [name] presents for evaluation of asthma. Patient's symptoms include dyspnea, non-productive cough and wheezing. Associated symptoms include chills. The patient has been suffering from these symptoms for approximately 6 months. Symptoms have been unchanged since their onset. Medications used in the past to treat these symptoms include combination beta agonists/steroid inhalers. Suspected precipitants include no identifiable factor. Patient is awoken from sleep approximately 1 times per night. Patient has required Emergency Room treatment for these symptoms, and has not required hospitalization. The patient has not been intubated in the past.  Tobacco Abuse  Currently smoking 1 pack per day. He is not interested in quitting.   No family history of prostate or colon cancer.   Overweight: Reports his diet is poor and he eats large amounts of fast food. Interested in bike riding.   Wt Readings from Last 3 Encounters:  06/09/20 183 lb 12.8 oz (83.4 kg)  01/18/20 170 lb (77.1 kg)  11/20/18 177 lb (80.3 kg)    Past Medical History:  Diagnosis Date  . Arm fracture, left 2009  . Asthma    Past Surgical History:  Procedure Laterality Date  . fracture Left 2009   Social History   Socioeconomic History  . Marital status: Single    Spouse name: Not on file  . Number of children: Not on file  . Years of education: Not on  file  . Highest education level: Not on file  Occupational History  . Not on file  Tobacco Use  . Smoking status: Current Every Day Smoker    Types: Cigarettes  . Smokeless tobacco: Never Used  Vaping Use  . Vaping Use: Never used  Substance and Sexual Activity  . Alcohol use: No  . Drug use: No  . Sexual activity: Not on file  Other Topics Concern  . Not on file  Social History Narrative  . Not on file   Social Determinants of Health   Financial Resource Strain:   . Difficulty of Paying Living Expenses: Not on file  Food Insecurity:   . Worried About Programme researcher, broadcasting/film/video in the Last Year: Not on file  . Ran Out of Food in the Last Year: Not on file  Transportation Needs:   . Lack of Transportation (Medical): Not on file  . Lack of Transportation (Non-Medical): Not on file  Physical Activity:   . Days of Exercise per Week: Not on file  . Minutes of Exercise per Session: Not on file  Stress:   . Feeling of Stress : Not on file  Social Connections:   . Frequency of Communication with Friends and Family: Not on file  . Frequency of Social Gatherings with Friends and Family: Not on file  . Attends Religious Services: Not on file  . Active Member of Clubs or Organizations: Not on file  . Attends Banker Meetings: Not on  file  . Marital Status: Not on file  Intimate Partner Violence:   . Fear of Current or Ex-Partner: Not on file  . Emotionally Abused: Not on file  . Physically Abused: Not on file  . Sexually Abused: Not on file   Family Status  Relation Name Status  . Mother  Alive  . Father  Alive  . Sister  Alive   Family History  Problem Relation Age of Onset  . Healthy Mother   . Healthy Father   . Healthy Sister    Allergies  Allergen Reactions  . Penicillin G Rash    Pt states allergic reaction as infant    Patient Care Team: Maryella Shivers as PCP - General (Physician Assistant)   Medications: Outpatient Medications Prior to  Visit  Medication Sig  . albuterol (PROVENTIL) (2.5 MG/3ML) 0.083% nebulizer solution Take 3 mLs (2.5 mg total) by nebulization every 6 (six) hours as needed for wheezing or shortness of breath.  . [DISCONTINUED] albuterol (PROAIR HFA) 108 (90 Base) MCG/ACT inhaler Inhale 2 puffs into the lungs every 6 (six) hours as needed for wheezing or shortness of breath.  . [DISCONTINUED] BREO ELLIPTA 100-25 MCG/INH AEPB Inhale 1 puff by mouth once daily (Patient not taking: Reported on 06/09/2020)   No facility-administered medications prior to visit.    Review of Systems  Constitutional: Negative.   HENT: Negative.   Eyes: Negative.   Respiratory: Negative.   Cardiovascular: Negative.   Gastrointestinal: Negative.   Endocrine: Negative.   Genitourinary: Negative.   Musculoskeletal: Negative.   Skin: Negative.   Allergic/Immunologic: Negative.   Neurological: Negative.   Hematological: Negative.   Psychiatric/Behavioral: Negative.        Objective    BP 127/88   Pulse (!) 115   Temp 98.4 F (36.9 C)   Resp 16   Ht 5' 8.5" (1.74 m)   Wt 183 lb 12.8 oz (83.4 kg)   BMI 27.54 kg/m     Physical Exam Constitutional:      Appearance: Normal appearance.  HENT:     Right Ear: Tympanic membrane normal.     Left Ear: Tympanic membrane normal.  Eyes:     Pupils: Pupils are equal, round, and reactive to light.  Cardiovascular:     Rate and Rhythm: Normal rate and regular rhythm.     Pulses: Normal pulses.     Heart sounds: Normal heart sounds.  Pulmonary:     Effort: Pulmonary effort is normal.     Breath sounds: Normal breath sounds.  Abdominal:     General: Bowel sounds are normal.  Musculoskeletal:        General: Normal range of motion.     Cervical back: Normal range of motion and neck supple.  Skin:    General: Skin is warm and dry.  Neurological:     Mental Status: He is alert and oriented to person, place, and time. Mental status is at baseline.  Psychiatric:         Mood and Affect: Mood normal.        Behavior: Behavior normal.       Last depression screening scores PHQ 2/9 Scores 06/09/2020 11/20/2018 10/15/2017  PHQ - 2 Score 0 0 0  PHQ- 9 Score - - 1   Last fall risk screening Fall Risk  06/09/2020  Falls in the past year? 0  Number falls in past yr: 0  Injury with Fall? 0  Risk for fall due  to : No Fall Risks  Follow up Falls evaluation completed   Last Audit-C alcohol use screening Alcohol Use Disorder Test (AUDIT) 06/09/2020  1. How often do you have a drink containing alcohol? 0  2. How many drinks containing alcohol do you have on a typical day when you are drinking? 0  3. How often do you have six or more drinks on one occasion? 0  AUDIT-C Score 0  Alcohol Brief Interventions/Follow-up AUDIT Score <7 follow-up not indicated   A score of 3 or more in women, and 4 or more in men indicates increased risk for alcohol abuse, EXCEPT if all of the points are from question 1   No results found for any visits on 06/09/20.  Assessment & Plan    Routine Health Maintenance and Physical Exam  Exercise Activities and Dietary recommendations Goals   None     Immunization History  Administered Date(s) Administered  . Hepatitis B 06/04/1999, 07/09/1999, 12/03/1999  . Tdap 10/15/2017    Health Maintenance  Topic Date Due  . Hepatitis C Screening  Never done  . COVID-19 Vaccine (1) 06/25/2020 (Originally 09/11/1999)  . INFLUENZA VACCINE  11/30/2020 (Originally 04/02/2020)  . TETANUS/TDAP  10/16/2027  . HIV Screening  Completed    Discussed health benefits of physical activity, and encouraged him to engage in regular exercise appropriate for his age and condition.  1. Annual physical exam   2. Uncomplicated asthma, unspecified asthma severity, unspecified whether persistent  - fluticasone furoate-vilanterol (BREO ELLIPTA) 100-25 MCG/INH AEPB; Inhale 1 puff by mouth once daily  Dispense: 60 each; Refill: 2 - albuterol (VENTOLIN HFA)  108 (90 Base) MCG/ACT inhaler; Inhale 2 puffs into the lungs every 6 (six) hours as needed for wheezing or shortness of breath.  Dispense: 1 each; Refill: 2  3. Tobacco use  Advised to quit and that smoking only exacerbates asthma, which has recently required ER treatment. Not interested in quitting.    No follow-ups on file.     ITrey Sailors, PA-C, have reviewed all documentation for this visit. The documentation on 06/15/20 for the exam, diagnosis, procedures, and orders are all accurate and complete.  The entirety of the information documented in the History of Present Illness, Review of Systems and Physical Exam were personally obtained by me. Portions of this information were initially documented by Anson [place], CMA and reviewed by me for thoroughness and accuracy.     Maryella Shivers  [facility] [phone] (phone) [phone] (fax)  [facility]

## 2021-01-08 ENCOUNTER — Telehealth: Payer: Self-pay

## 2021-01-08 DIAGNOSIS — J45909 Unspecified asthma, uncomplicated: Secondary | ICD-10-CM

## 2021-01-08 MED ORDER — ALBUTEROL SULFATE HFA 108 (90 BASE) MCG/ACT IN AERS: 2.0000 | INHALATION_SPRAY | Freq: Four times a day (QID) | RESPIRATORY_TRACT | 2 refills | Status: DC | PRN

## 2021-01-08 NOTE — Telephone Encounter (Signed)
[facility] faxed refill request for the following medications:  albuterol (VENTOLIN HFA) 108 (90 Base) MCG/ACT inhaler   Please advise.  

## 2021-06-04 ENCOUNTER — Telehealth: Payer: Self-pay

## 2021-06-04 DIAGNOSIS — J45909 Unspecified asthma, uncomplicated: Secondary | ICD-10-CM

## 2021-06-04 MED ORDER — ALBUTEROL SULFATE HFA 108 (90 BASE) MCG/ACT IN AERS: 2.0000 | INHALATION_SPRAY | Freq: Four times a day (QID) | RESPIRATORY_TRACT | 2 refills | Status: DC | PRN

## 2021-06-04 NOTE — Telephone Encounter (Signed)
Copied from CRM [id number]. Topic: General - Inquiry >> Jun 04, 2021  3:15 PM Daphine Deutscher D wrote: Reason for CRM: Pt called saying he has an appt on the 10th but he needs one refill on his generic inhaler before that date.  [facility] on [address]

## 2021-06-15 ENCOUNTER — Encounter: Payer: Self-pay | Admitting: Family Medicine

## 2021-06-15 ENCOUNTER — Ambulatory Visit: Payer: Commercial Managed Care - PPO | Admitting: Family Medicine

## 2021-06-15 ENCOUNTER — Other Ambulatory Visit: Payer: Self-pay

## 2021-06-15 ENCOUNTER — Encounter: Payer: Self-pay | Admitting: Physician Assistant

## 2021-06-15 VITALS — BP 134/92 | HR 86 | Temp 98.2°F | Resp 16 | Ht 69.0 in | Wt 194.0 lb

## 2021-06-15 DIAGNOSIS — Z720 Tobacco use: Secondary | ICD-10-CM

## 2021-06-15 DIAGNOSIS — Z13220 Encounter for screening for lipoid disorders: Secondary | ICD-10-CM | POA: Insufficient documentation

## 2021-06-15 DIAGNOSIS — J4540 Moderate persistent asthma, uncomplicated: Secondary | ICD-10-CM

## 2021-06-15 DIAGNOSIS — Z136 Encounter for screening for cardiovascular disorders: Secondary | ICD-10-CM

## 2021-06-15 DIAGNOSIS — R030 Elevated blood-pressure reading, without diagnosis of hypertension: Secondary | ICD-10-CM | POA: Insufficient documentation

## 2021-06-15 DIAGNOSIS — R635 Abnormal weight gain: Secondary | ICD-10-CM | POA: Insufficient documentation

## 2021-06-15 DIAGNOSIS — Z0000 Encounter for general adult medical examination without abnormal findings: Secondary | ICD-10-CM

## 2021-06-15 NOTE — Assessment & Plan Note (Signed)
Has cut back on use- happy about, does not know how we can help further Smokes black/milds 1-2 packs/week

## 2021-06-15 NOTE — Assessment & Plan Note (Signed)
Does not use Breo- advised to refill, now that he has insurance Notes that he has a 'tightness or wheeze' where he will use his PRN and it varies in frequency from 1x/wk to 1x/day- advise to stop smoking and start preventive medication to assist Does not wish to restart singulair

## 2021-06-15 NOTE — Progress Notes (Signed)
I,April Miller,acting as a scribe for Jacky Kindle, FNP.,have documented all relevant documentation on the behalf of Jacky Kindle, FNP,as directed by  Jacky Kindle, FNP while in the presence of Jacky Kindle, FNP.   Complete physical exam   Patient: Kyle Obrien   DOB: [birth year]   35 y.o. Male  MRN: [id number] Visit Date: 06/15/2021  Today's healthcare provider: Jacky Kindle, FNP   Chief Complaint  Patient presents with   Annual Exam   Subjective    SPYROS WINCH is a 35 y.o. male who presents today for a complete physical exam.  He reports consuming a general diet. The patient does not participate in regular exercise at present. He generally feels well. He reports sleeping fairly well. He does not have additional problems to discuss today.   Works as a Secondary school teacher, has 2 boys- 9 and 2. HPI    Past Medical History:  Diagnosis Date   Arm fracture, left 2009   Asthma    Past Surgical History:  Procedure Laterality Date   fracture Left 2009   Social History   Socioeconomic History   Marital status: Single    Spouse name: Not on file   Number of children: Not on file   Years of education: Not on file   Highest education level: Not on file  Occupational History   Not on file  Tobacco Use   Smoking status: Every Day    Types: Cigarettes   Smokeless tobacco: Never  Vaping Use   Vaping Use: Never used  Substance and Sexual Activity   Alcohol use: No   Drug use: No   Sexual activity: Not on file  Other Topics Concern   Not on file  Social History Narrative   Not on file   Social Determinants of Health   Financial Resource Strain: Not on file  Food Insecurity: Not on file  Transportation Needs: Not on file  Physical Activity: Not on file  Stress: Not on file  Social Connections: Not on file  Intimate Partner Violence: Not on file   Family Status  Relation Name Status   Mother  Alive   Father  Alive   Sister  Alive   Family History   Problem Relation Age of Onset   Healthy Mother    Healthy Father    Healthy Sister    Allergies  Allergen Reactions   Penicillin G Rash    Pt states allergic reaction as infant    Patient Care Team: Jacky Kindle, FNP as PCP - General (Family Medicine)   Medications: Outpatient Medications Prior to Visit  Medication Sig   albuterol (VENTOLIN HFA) 108 (90 Base) MCG/ACT inhaler Inhale 2 puffs into the lungs every 6 (six) hours as needed for wheezing or shortness of breath.   fluticasone furoate-vilanterol (BREO ELLIPTA) 100-25 MCG/INH AEPB Inhale 1 puff by mouth once daily   [DISCONTINUED] albuterol (PROVENTIL) (2.5 MG/3ML) 0.083% nebulizer solution Take 3 mLs (2.5 mg total) by nebulization every 6 (six) hours as needed for wheezing or shortness of breath. (Patient not taking: Reported on 06/15/2021)   No facility-administered medications prior to visit.    Review of Systems  All other systems reviewed and are negative.    Objective    BP (!) 134/92 (BP Location: Left Arm, Patient Position: Sitting, Cuff Size: Large)   Pulse 86   Temp 98.2 F (36.8 C) (Temporal)   Resp 16   Ht 5'  9" (1.753 m)   Wt 194 lb (88 kg)   SpO2 97%   BMI 28.65 kg/m    Physical Exam Vitals and nursing note reviewed.  Constitutional:      General: He is awake. He is not in acute distress.    Appearance: Normal appearance. He is well-developed, well-groomed and overweight. He is not ill-appearing, toxic-appearing or diaphoretic.  HENT:     Head: Normocephalic and atraumatic.     Jaw: There is normal jaw occlusion. No trismus, tenderness, swelling or pain on movement.     Salivary Glands: Right salivary gland is not diffusely enlarged or tender. Left salivary gland is not diffusely enlarged or tender.     Right Ear: Hearing, tympanic membrane, ear canal and external ear normal. There is no impacted cerumen.     Left Ear: Hearing, tympanic membrane, ear canal and external ear normal. There is no  impacted cerumen.     Nose: Nose normal. No congestion or rhinorrhea.     Right Turbinates: Not enlarged, swollen or pale.     Left Turbinates: Not enlarged, swollen or pale.     Right Sinus: No maxillary sinus tenderness or frontal sinus tenderness.     Left Sinus: No maxillary sinus tenderness or frontal sinus tenderness.     Mouth/Throat:     Lips: Pink.     Mouth: Mucous membranes are moist. No injury, lacerations, oral lesions or angioedema.     Pharynx: Oropharynx is clear. Uvula midline. No pharyngeal swelling, oropharyngeal exudate or posterior oropharyngeal erythema.     Tonsils: No tonsillar exudate or tonsillar abscesses.  Eyes:     General: Lids are normal. Vision grossly intact. Gaze aligned appropriately.        Right eye: No discharge.        Left eye: No discharge.     Extraocular Movements: Extraocular movements intact.     Conjunctiva/sclera: Conjunctivae normal.     Pupils: Pupils are equal, round, and reactive to light.  Neck:     Thyroid: Thyromegaly present. No thyroid mass.     Vascular: No carotid bruit.     Trachea: Trachea normal. No tracheal tenderness.  Cardiovascular:     Rate and Rhythm: Normal rate and regular rhythm.     Pulses: Normal pulses.          Carotid pulses are 2+ on the right side and 2+ on the left side.      Radial pulses are 2+ on the right side and 2+ on the left side.       Femoral pulses are 2+ on the right side and 2+ on the left side.      Popliteal pulses are 2+ on the right side and 2+ on the left side.       Dorsalis pedis pulses are 2+ on the right side and 2+ on the left side.       Posterior tibial pulses are 2+ on the right side and 2+ on the left side.     Heart sounds: Normal heart sounds, S1 normal and S2 normal. No murmur heard.   No friction rub. No gallop.  Pulmonary:     Effort: Pulmonary effort is normal. No respiratory distress.     Breath sounds: Normal breath sounds and air entry. No stridor. No wheezing, rhonchi  or rales.  Chest:     Chest wall: No tenderness.  Abdominal:     General: Abdomen is flat. Bowel sounds are normal. There  is no distension.     Palpations: Abdomen is soft. There is no mass.     Tenderness: There is no abdominal tenderness. There is no guarding or rebound.     Hernia: No hernia is present.  Genitourinary:    Comments: Exam deferred; denies complaints Musculoskeletal:        General: No swelling, tenderness, deformity or signs of injury. Normal range of motion.     Cervical back: Normal range of motion and neck supple. No rigidity or tenderness.     Right lower leg: No edema.     Left lower leg: No edema.  Lymphadenopathy:     Cervical: No cervical adenopathy.     Right cervical: No superficial, deep or posterior cervical adenopathy.    Left cervical: No superficial, deep or posterior cervical adenopathy.  Skin:    General: Skin is warm and dry.     Capillary Refill: Capillary refill takes less than 2 seconds.     Coloration: Skin is not jaundiced or pale.     Findings: No bruising, erythema, lesion or rash.  Neurological:     General: No focal deficit present.     Mental Status: He is alert and oriented to person, place, and time. Mental status is at baseline.     GCS: GCS eye subscore is 4. GCS verbal subscore is 5. GCS motor subscore is 6.     Cranial Nerves: Cranial nerves are intact.     Sensory: Sensation is intact. No sensory deficit.     Motor: Motor function is intact. No weakness.     Coordination: Coordination is intact.     Gait: Gait is intact.  Psychiatric:        Attention and Perception: Attention and perception normal.        Mood and Affect: Mood and affect normal.        Speech: Speech normal.        Behavior: Behavior normal. Behavior is cooperative.        Thought Content: Thought content normal.        Cognition and Memory: Cognition normal.        Judgment: Judgment normal.     Last depression screening scores PHQ 2/9 Scores  06/15/2021 06/09/2020 11/20/2018  PHQ - 2 Score - 0 0  PHQ- 9 Score - - -  Exception Documentation Patient refusal - -   Last fall risk screening Fall Risk  06/15/2021  Falls in the past year? 0  Number falls in past yr: 0  Injury with Fall? 0  Risk for fall due to : No Fall Risks  Follow up Falls evaluation completed   Last Audit-C alcohol use screening Alcohol Use Disorder Test (AUDIT) 06/15/2021  1. How often do you have a drink containing alcohol? 1  2. How many drinks containing alcohol do you have on a typical day when you are drinking? 0  3. How often do you have six or more drinks on one occasion? 0  AUDIT-C Score 1  Alcohol Brief Interventions/Follow-up -   A score of 3 or more in women, and 4 or more in men indicates increased risk for alcohol abuse, EXCEPT if all of the points are from question 1   No results found for any visits on 06/15/21.  Assessment & Plan    Routine Health Maintenance and Physical Exam  Exercise Activities and Dietary recommendations  Goals      Quit Smoking  Immunization History  Administered Date(s) Administered   Hepatitis B 06/04/1999, 07/09/1999, 12/03/1999   Tdap 10/15/2017    Health Maintenance  Topic Date Due   COVID-19 Vaccine (1) Never done   Hepatitis C Screening  Never done   INFLUENZA VACCINE  Never done   TETANUS/TDAP  10/16/2027   HIV Screening  Completed   HPV VACCINES  Aged Out    Discussed health benefits of physical activity, and encouraged him to engage in regular exercise appropriate for his age and condition.  Problem List Items Addressed This Visit       Cardiovascular and Mediastinum   White coat syndrome with high blood pressure without hypertension    CTM Encouraged lifestyle changes -diet -exercise Pt happy to lost 5# since stopping energy drinks in 12/2020, now drinking Pepsi in place.  -works overnight        Respiratory   Asthma    Does not use Breo- advised to refill, now that he  has insurance Notes that he has a 'tightness or wheeze' where he will use his PRN and it varies in frequency from 1x/wk to 1x/day- advise to stop smoking and start preventive medication to assist Does not wish to restart singulair        Other   Tobacco use    Has cut back on use- happy about, does not know how we can help further Smokes black/milds 1-2 packs/week      Weight gain    Check thyroid levels      Relevant Orders   TSH + free T4   Encounter for lipid screening for cardiovascular disease    Lipid panel given central weight gain and sedentary work as well as smoking RF      Relevant Orders   Lipid panel   Annual physical exam - Primary    Needs to see eye dr and dentist Things to do to keep yourself healthy  - Exercise at least 30-45 minutes a day, 3-4 days a week.  - Eat a low-fat diet with lots of fruits and vegetables, up to 7-9 servings per day.  - Seatbelts can save your life. Wear them always.  - Smoke detectors on every level of your home, check batteries every year.  - Eye Doctor - have an eye exam every 1-2 years  - Safe sex - if you may be exposed to STDs, use a condom.  - Alcohol -  If you drink, do it moderately, less than 2 drinks per day.  - Health Care Power of Attorney. Choose someone to speak for you if you are not able.  - Depression is common in our stressful world.If you're feeling down or losing interest in things you normally enjoy, please come in for a visit.  - Violence - If anyone is threatening or hurting you, please call immediately.          Return in about 1 year (around 06/15/2022) for annual examination.    Leilani Merl, FNP, have reviewed all documentation for this visit. The documentation on 06/15/21 for the exam, diagnosis, procedures, and orders are all accurate and complete.    Jacky Kindle, FNP  [facility] [phone] (phone) [phone] (fax)  [facility]

## 2021-06-15 NOTE — Assessment & Plan Note (Signed)
Needs to see eye dr and dentist Things to do to keep yourself healthy  - Exercise at least 30-45 minutes a day, 3-4 days a week.  - Eat a low-fat diet with lots of fruits and vegetables, up to 7-9 servings per day.  - Seatbelts can save your life. Wear them always.  - Smoke detectors on every level of your home, check batteries every year.  - Eye Doctor - have an eye exam every 1-2 years  - Safe sex - if you may be exposed to STDs, use a condom.  - Alcohol -  If you drink, do it moderately, less than 2 drinks per day.  - Health Care Power of Attorney. Choose someone to speak for you if you are not able.  - Depression is common in our stressful world.If you're feeling down or losing interest in things you normally enjoy, please come in for a visit.  - Violence - If anyone is threatening or hurting you, please call immediately.

## 2021-06-15 NOTE — Assessment & Plan Note (Signed)
Check thyroid levels °

## 2021-06-15 NOTE — Assessment & Plan Note (Signed)
CTM Encouraged lifestyle changes -diet -exercise Pt happy to lost 5# since stopping energy drinks in 12/2020, now drinking Pepsi in place.  -works overnight

## 2021-06-15 NOTE — Assessment & Plan Note (Signed)
Lipid panel given central weight gain and sedentary work as well as smoking RF

## 2021-06-15 NOTE — Progress Notes (Deleted)
      Established patient visit   Patient: Kyle Obrien   DOB: 01-Oct-1985   35 y.o. Male  MRN: [id number] Visit Date: 06/15/2021  Today's healthcare provider: Jacky Kindle, FNP   No chief complaint on file.  Subjective    HPI  Follow up for Uncomplicated asthma:  The patient was last seen for this 1   ago. Changes made at last visit include; uses Neb, Breo and albuterol inhalers.  He reports good compliance with treatment. He feels that condition is Unchanged. He is not having side effects. none  -----------------------------------------------------------------------------------------   {Link to patient history deactivated due to formatting error:1}  Medications: Outpatient Medications Prior to Visit  Medication Sig   albuterol (PROVENTIL) (2.5 MG/3ML) 0.083% nebulizer solution Take 3 mLs (2.5 mg total) by nebulization every 6 (six) hours as needed for wheezing or shortness of breath.   albuterol (VENTOLIN HFA) 108 (90 Base) MCG/ACT inhaler Inhale 2 puffs into the lungs every 6 (six) hours as needed for wheezing or shortness of breath.   fluticasone furoate-vilanterol (BREO ELLIPTA) 100-25 MCG/INH AEPB Inhale 1 puff by mouth once daily   No facility-administered medications prior to visit.    Review of Systems  Constitutional:  Negative for appetite change, chills and fever.  Respiratory:  Negative for chest tightness, shortness of breath and wheezing.   Cardiovascular:  Negative for chest pain and palpitations.  Gastrointestinal:  Negative for abdominal pain, nausea and vomiting.   {Labs  Heme  Chem  Endocrine  Serology  Results Review (optional):23779}   Objective    There were no vitals taken for this visit. {Show previous vital signs (optional):23777}  Physical Exam  ***  No results found for any visits on 06/15/21.  Assessment & Plan     ***  No follow-ups on file.      {provider attestation***:1}   Jacky Kindle, FNP  [facility] [phone] (phone) [phone] (fax)  [facility]

## 2021-06-16 LAB — LIPID PANEL
Chol/HDL Ratio: 3.6 ratio (ref 0.0–5.0)
Cholesterol, Total: 141 mg/dL (ref 100–199)
HDL: 39 mg/dL — ABNORMAL LOW (ref >39)
LDL Chol Calc (NIH): 72 mg/dL (ref 0–99)
Triglycerides: 179 mg/dL — ABNORMAL HIGH (ref 0–149)
VLDL Cholesterol Cal: 30 mg/dL (ref 5–40)

## 2021-06-16 LAB — TSH+FREE T4
Free T4: 1.25 ng/dL (ref 0.82–1.77)
TSH: 2.11 u[IU]/mL (ref 0.450–4.500)

## 2021-10-02 ENCOUNTER — Ambulatory Visit: Payer: Self-pay

## 2021-10-02 NOTE — Telephone Encounter (Signed)
°  Chief Complaint: headache and sore throat Symptoms: pounding headache, sore throat, warm to touch Frequency: 1 week for sore throat and headache has gotten worse today Pertinent Negatives: Patient denies congestion or SOB Disposition: [] ED /[] Urgent Care (no appt availability in office) / [x] Appointment(In office/virtual)/ []  [facility]/ [] Home Care/ [] Refused Recommended Disposition /[] [facility]/ []  Follow-up with PCP Additional Notes: Pt states he has taken headache medicine OTC and not helping as well as tried everything for sore throat and nothing helps for more than 1 hour. He took a COVID home test today and was negative. Pt is needing a work note printed out and will come to office to pick up.    Reason for Disposition  [1] Sinus pain (not just congestion) AND [2] fever  Answer Assessment - Initial Assessment Questions 1. LOCATION: "Where does it hurt?"      Front  2. ONSET: "When did the sinus pain start?"  (e.g., hours, days)      Sore throat 1 week  3. SEVERITY: "How bad is the pain?"   (Scale 1-10; mild, moderate or severe)   - MILD (1-3): doesn't interfere with normal activities    - MODERATE (4-7): interferes with normal activities (e.g., work or school) or awakens from sleep   - SEVERE (8-10): excruciating pain and patient unable to do any normal activities        4-5 4. RECURRENT SYMPTOM: "Have you ever had sinus problems before?" If Yes, ask: "When was the last time?" and "What happened that time?"      unsure 5. NASAL CONGESTION: "Is the nose blocked?" If Yes, ask: "Can you open it or must you breathe through your mouth?"     no 6. NASAL DISCHARGE: "Do you have discharge from your nose?" If so ask, "What color?"     no 7. FEVER: "Do you have a fever?" If Yes, ask: "What is it, how was it measured, and when did it start?"      Hadnt checked 8. OTHER SYMPTOMS: "Do you have any other symptoms?" (e.g., sore throat, cough, earache, difficulty  breathing)     Sore throat, earache at times  Protocols used: Sinus Pain or Congestion-A-AH

## 2021-10-03 ENCOUNTER — Other Ambulatory Visit: Payer: Self-pay

## 2021-10-03 ENCOUNTER — Telehealth (INDEPENDENT_AMBULATORY_CARE_PROVIDER_SITE_OTHER): Payer: Commercial Managed Care - PPO | Admitting: Family Medicine

## 2021-10-03 ENCOUNTER — Encounter: Payer: Self-pay | Admitting: Family Medicine

## 2021-10-03 DIAGNOSIS — J029 Acute pharyngitis, unspecified: Secondary | ICD-10-CM | POA: Diagnosis not present

## 2021-10-03 DIAGNOSIS — K219 Gastro-esophageal reflux disease without esophagitis: Secondary | ICD-10-CM

## 2021-10-03 MED ORDER — AZITHROMYCIN 250 MG PO TABS: ORAL_TABLET | ORAL | 0 refills | Status: AC

## 2021-10-03 NOTE — Progress Notes (Signed)
MyChart Video Visit    Virtual Visit via Video Note   This visit type was conducted due to national recommendations for restrictions regarding the COVID-19 Pandemic (e.g. social distancing) in an effort to limit this patient's exposure and mitigate transmission in our community. This patient is at least at moderate risk for complications without adequate follow up. This format is felt to be most appropriate for this patient at this time. Physical exam was limited by quality of the video and audio technology used for the visit.   Patient location: home Provider location: [facility]  I discussed the limitations of evaluation and management by telemedicine and the availability of in person appointments. The patient expressed understanding and agreed to proceed.  Patient: Kyle Obrien   DOB: [birth year]   36 y.o. Male  MRN: [id number] Visit Date: 10/03/2021  Today's healthcare provider: Mila Merry, MD   Chief Complaint  Patient presents with   Sore Throat   Subjective    Sore Throat  This is a new problem. Episode onset: 1 week ago. The problem has been unchanged. Neither side of throat is experiencing more pain than the other. There has been no fever. Associated symptoms include ear pain, headaches (yesterday pounding; now resolved) and trouble swallowing. Pertinent negatives include no abdominal pain, congestion, coughing, shortness of breath or vomiting. Associated symptoms comments: Pain when swallowing. He has tried NSAIDs and gargles (chloroseptic spray) for the symptoms.   Patient took a home COVID test yesterday and the result was negative.  He does report having frequent heartburn mainly at night. Was previously on omeprazole not taken for several months.   Medications: Outpatient Medications Prior to Visit  Medication Sig   albuterol (VENTOLIN HFA) 108 (90 Base) MCG/ACT inhaler Inhale 2 puffs into the lungs every 6 (six) hours as needed for wheezing or shortness of breath.    fluticasone furoate-vilanterol (BREO ELLIPTA) 100-25 MCG/INH AEPB Inhale 1 puff by mouth once daily (Patient not taking: Reported on 10/03/2021)   No facility-administered medications prior to visit.    Review of Systems  Constitutional:  Negative for appetite change, chills and fever.  HENT:  Positive for ear pain and trouble swallowing. Negative for congestion.   Respiratory:  Negative for cough, chest tightness, shortness of breath and wheezing.   Cardiovascular:  Negative for chest pain and palpitations.  Gastrointestinal:  Negative for abdominal pain, nausea and vomiting.  Neurological:  Positive for headaches (yesterday pounding; now resolved).     Objective    There were no vitals taken for this visit.   Physical Exam   Awake, alert, oriented x 3. In no apparent distress   Assessment & Plan     1. Sore throat Persistent for over a week with no other viral URI sx. He does also have reflux sx as below. Recommend he start back on omeprazole every day. If not improving within a few days then start  azithromycin (ZITHROMAX) 250 MG tablet; Take 2 tablets on day 1, then 1 tablet daily on days 2 through 5  Dispense: 6 tablet; Refill: 0  2. Gastroesophageal reflux disease, unspecified whether esophagitis present Previously did well with omeprazole.      I discussed the assessment and treatment plan with the patient. The patient was provided an opportunity to ask questions and all were answered. The patient agreed with the plan and demonstrated an understanding of the instructions.   The patient was advised to call back or seek an in-person evaluation if the  symptoms worsen or if the condition fails to improve as anticipated.  I provided 11 minutes of non-face-to-face time during this encounter.  The entirety of the information documented in the History of Present Illness, Review of Systems and Physical Exam were personally obtained by me. Portions of this information were initially  documented by the CMA and reviewed by me for thoroughness and accuracy.    Mila Merry, MD [facility] [phone] (phone) [phone] (fax)  [facility]

## 2021-11-16 ENCOUNTER — Other Ambulatory Visit: Payer: Self-pay | Admitting: Family Medicine

## 2021-11-16 DIAGNOSIS — J45909 Unspecified asthma, uncomplicated: Secondary | ICD-10-CM

## 2021-11-16 NOTE — Telephone Encounter (Signed)
Requested Prescriptions  ?Pending Prescriptions Disp Refills  ?? albuterol (VENTOLIN HFA) 108 (90 Base) MCG/ACT inhaler [Pharmacy Med Name: Albuterol Sulfate HFA 108 (90 Base) MCG/ACT Inhalation Aerosol Solution] 1 each 2  ?  Sig: INHALE 2 PUFFS INTO THE LUNGS EVERY 6 HOURS AS NEEDED FOR WHEEZING OR SHORTNESS OF BREATH.  ?  ? Pulmonology:  Beta Agonists 2 Failed - 11/16/2021 11:12 AM  ?  ?  Failed - Last BP in normal range  ?  BP Readings from Last 1 Encounters:  ?06/15/21 (!) 134/92  ?   ?  ?  Passed - Last Heart Rate in normal range  ?  Pulse Readings from Last 1 Encounters:  ?06/15/21 86  ?   ?  ?  Passed - Valid encounter within last 12 months  ?  Recent Outpatient Visits   ?      ? 1 month ago Sore throat  ? [place], [place]  ? 5 months ago Annual physical exam  ? [facility] Gwyneth Sprout, FNP  ? 1 year ago Annual physical exam  ? [facility] Trinna Post, [place]  ? 2 years ago Annual physical exam  ? [facility] Carles Collet M, [place]  ? 4 years ago Uncomplicated asthma, unspecified asthma severity, unspecified whether persistent  ? [facility] [place], [place], [place]  ?  ?  ?Future Appointments   ?        ? In 7 months Gwyneth Sprout, [place], [facility]  ?  ? ?  ?  ?  ? ?

## 2022-05-24 ENCOUNTER — Other Ambulatory Visit: Payer: Self-pay | Admitting: Family Medicine

## 2022-05-24 DIAGNOSIS — J45909 Unspecified asthma, uncomplicated: Secondary | ICD-10-CM

## 2022-06-20 NOTE — Progress Notes (Signed)
Annual Wellness Visit     Patient: Kyle Obrien, Male    DOB: [birth year], 36 y.o.   MRN: [id number] Visit Date: 06/21/2022  Today's Provider: Jacky Kindle, FNP  Introduced to nurse practitioner role and practice setting.  All questions answered.  Discussed provider/patient relationship and expectations.  I,Kyle Obrien,acting as a scribe for Jacky Kindle, FNP.,have documented all relevant documentation on the behalf of Jacky Kindle, FNP,as directed by  Jacky Kindle, FNP while in the presence of Jacky Kindle, FNP.   Chief Complaint  Patient presents with   Annual Exam   Subjective    Kyle Obrien is a 36 y.o. male who presents today for his Annual Wellness Visit. He reports consuming a general diet. Home exercise routine includes walking. He generally feels fairly well. He reports sleeping well. He does not have additional problems to discuss today.   HPI    Medications: Outpatient Medications Prior to Visit  Medication Sig   albuterol (VENTOLIN HFA) 108 (90 Base) MCG/ACT inhaler INHALE 2 PUFFS BY MOUTH EVERY 6 HOURS AS NEEDED FOR WHEEZING FOR SHORTNESS OF BREATH   [DISCONTINUED] fluticasone furoate-vilanterol (BREO ELLIPTA) 100-25 MCG/INH AEPB Inhale 1 puff by mouth once daily (Patient not taking: Reported on 10/03/2021)   No facility-administered medications prior to visit.    Allergies  Allergen Reactions   Penicillin G Rash and Other (See Comments)    Pt states allergic reaction as infant Pt states allergic reaction as infant Pt states allergic reaction as infant    Patient Care Team: Jacky Kindle, FNP as PCP - General (Family Medicine)  Review of Systems    Objective    Vitals: BP (!) 128/96 (BP Location: Right Arm, Patient Position: Sitting, Cuff Size: Large)   Pulse 88   Resp 16   Ht 5\' 9"  (1.753 m)   Wt 211 lb (95.7 kg)   SpO2 99%   BMI 31.16 kg/m    Physical Exam Vitals and nursing note reviewed.  Constitutional:      General: He  is awake. He is not in acute distress.    Appearance: Normal appearance. He is well-developed and well-groomed. He is obese. He is not ill-appearing, toxic-appearing or diaphoretic.  HENT:     Head: Normocephalic and atraumatic.     Jaw: There is normal jaw occlusion. No trismus, tenderness, swelling or pain on movement.     Salivary Glands: Right salivary gland is not diffusely enlarged or tender. Left salivary gland is not diffusely enlarged or tender.     Right Ear: Hearing, tympanic membrane, ear canal and external ear normal. There is no impacted cerumen.     Left Ear: Hearing, tympanic membrane, ear canal and external ear normal. There is no impacted cerumen.     Nose: Nose normal. No congestion or rhinorrhea.     Right Turbinates: Not enlarged, swollen or pale.     Left Turbinates: Not enlarged, swollen or pale.     Right Sinus: No maxillary sinus tenderness or frontal sinus tenderness.     Left Sinus: No maxillary sinus tenderness or frontal sinus tenderness.     Mouth/Throat:     Lips: Pink.     Mouth: Mucous membranes are moist. No injury, lacerations, oral lesions or angioedema.     Pharynx: Oropharynx is clear. Uvula midline. No pharyngeal swelling, oropharyngeal exudate or posterior oropharyngeal erythema.     Tonsils: No tonsillar exudate or tonsillar abscesses.  Eyes:  General: Lids are normal. Vision grossly intact. Gaze aligned appropriately.        Right eye: No discharge.        Left eye: No discharge.     Extraocular Movements: Extraocular movements intact.     Conjunctiva/sclera: Conjunctivae normal.     Pupils: Pupils are equal, round, and reactive to light.  Neck:     Thyroid: No thyroid mass, thyromegaly or thyroid tenderness.     Vascular: No carotid bruit.     Trachea: Trachea normal. No tracheal tenderness.  Cardiovascular:     Rate and Rhythm: Normal rate and regular rhythm.     Pulses: Normal pulses.          Carotid pulses are 2+ on the right side and  2+ on the left side.      Radial pulses are 2+ on the right side and 2+ on the left side.       Femoral pulses are 2+ on the right side and 2+ on the left side.      Popliteal pulses are 2+ on the right side and 2+ on the left side.       Dorsalis pedis pulses are 2+ on the right side and 2+ on the left side.       Posterior tibial pulses are 2+ on the right side and 2+ on the left side.     Heart sounds: Normal heart sounds, S1 normal and S2 normal. No murmur heard.    No friction rub. No gallop.  Pulmonary:     Effort: Pulmonary effort is normal. No respiratory distress.     Breath sounds: Normal breath sounds and air entry. No stridor. No wheezing, rhonchi or rales.  Chest:     Chest wall: No tenderness.  Abdominal:     General: Abdomen is flat. Bowel sounds are normal. There is no distension.     Palpations: Abdomen is soft. There is no mass.     Tenderness: There is no abdominal tenderness. There is no guarding or rebound.     Hernia: No hernia is present.  Genitourinary:    Comments: Exam deferred; denies complaints Musculoskeletal:        General: No swelling, tenderness, deformity or signs of injury. Normal range of motion.     Cervical back: Normal range of motion and neck supple. No rigidity or tenderness.     Right lower leg: No edema.     Left lower leg: No edema.  Lymphadenopathy:     Cervical: No cervical adenopathy.     Right cervical: No superficial, deep or posterior cervical adenopathy.    Left cervical: No superficial, deep or posterior cervical adenopathy.  Skin:    General: Skin is warm and dry.     Capillary Refill: Capillary refill takes less than 2 seconds.     Coloration: Skin is not jaundiced or pale.     Findings: No bruising, erythema, lesion or rash.  Neurological:     General: No focal deficit present.     Mental Status: He is alert and oriented to person, place, and time. Mental status is at baseline.     GCS: GCS eye subscore is 4. GCS verbal  subscore is 5. GCS motor subscore is 6.     Sensory: Sensation is intact. No sensory deficit.     Motor: Motor function is intact. No weakness.     Coordination: Coordination is intact.     Gait: Gait is intact.  Psychiatric:        Attention and Perception: Attention and perception normal.        Mood and Affect: Mood and affect normal.        Speech: Speech normal.        Behavior: Behavior normal. Behavior is cooperative.        Thought Content: Thought content normal.        Cognition and Memory: Cognition normal.        Judgment: Judgment normal.     Most recent functional status assessment:    06/21/2022    3:06 PM  In your present state of health, do you have any difficulty performing the following activities:  Hearing? 0  Vision? 0  Difficulty concentrating or making decisions? 0  Walking or climbing stairs? 0  Dressing or bathing? 0  Doing errands, shopping? 0   Most recent fall risk assessment:    06/21/2022    3:05 PM  Fall Risk   Falls in the past year? 0  Number falls in past yr: 0  Injury with Fall? 0  Risk for fall due to : No Fall Risks  Follow up Falls evaluation completed    Most recent depression screenings:    06/21/2022    3:05 PM 06/15/2021    2:50 PM  PHQ 2/9 Scores  PHQ - 2 Score 0   PHQ- 9 Score 0   Exception Documentation  Patient refusal   Most recent cognitive screening:     No data to display         Most recent Audit-C alcohol use screening    06/21/2022    3:06 PM  Alcohol Use Disorder Test (AUDIT)  1. How often do you have a drink containing alcohol? 1  3. How often do you have six or more drinks on one occasion? 0   A score of 3 or more in women, and 4 or more in men indicates increased risk for alcohol abuse, EXCEPT if all of the points are from question 1   No results found for any visits on 06/21/22.  Assessment & Plan     Annual wellness visit done today including the all of the following: Reviewed patient's  Family Medical History Reviewed and updated list of patient's medical providers Assessment of cognitive impairment was done Assessed patient's functional ability Established a written schedule for health screening services Health Risk Assessent Completed and Reviewed  Exercise Activities and Dietary recommendations  Goals      Quit Smoking        Immunization History  Administered Date(s) Administered   Hepatitis B 06/04/1999, 07/09/1999, 12/03/1999   Tdap 10/15/2017    Health Maintenance  Topic Date Due   Hepatitis C Screening  Never done   COVID-19 Vaccine (1) 07/07/2022 (Originally 03/10/1986)   INFLUENZA VACCINE  12/01/2022 (Originally 04/02/2022)   TETANUS/TDAP  10/16/2027   HIV Screening  Completed   HPV VACCINES  Aged Out     Discussed health benefits of physical activity, and encouraged him to engage in regular exercise appropriate for his age and condition.    Problem List Items Addressed This Visit       Other   Annual physical exam - Primary    Due for dental Due for vision Has been working swinging shifts on the road Congratulated on quitting smoking for 10 months Things to do to keep yourself healthy  - Exercise at least 30-45 minutes a day, 3-4 days a week.  -  Eat a low-fat diet with lots of fruits and vegetables, up to 7-9 servings per day.  - Seatbelts can save your life. Wear them always.  - Smoke detectors on every level of your home, check batteries every year.  - Eye Doctor - have an eye exam every 1-2 years  - Safe sex - if you may be exposed to STDs, use a condom.  - Alcohol -  If you drink, do it moderately, less than 2 drinks per day.  - [place]. Choose someone to speak for you if you are not able.  - Depression is common in our stressful world.If you're feeling down or losing interest in things you normally enjoy, please come in for a visit.  - Violence - If anyone is threatening or hurting you, please call  immediately.        Relevant Orders   CBC with Differential/Platelet   Comprehensive Metabolic Panel (CMET)   Lipid panel   TSH + free T4   Elevated blood-pressure reading, without diagnosis of hypertension    Declines medication at this time Recommend diuretic to assist as well as diet/exercise like DASH diet and increased purposeful walking       Elevated serum glucose    Check A1c given weight gain and hx of elevated serum glucose       Relevant Orders   Hemoglobin A1c   Encounter for hepatitis C screening test for low risk patient    Low risk screen Treatable, and curable. If left untreated Hep C can lead to cirrhosis and liver failure. Encourage routine testing; recommend repeat testing if risk factors change.       Relevant Orders   Hepatitis C Antibody   Return in about 1 year (around 06/22/2023) for annual examination.    Vonna Kotyk, FNP, have reviewed all documentation for this visit. The documentation on 06/21/22 for the exam, diagnosis, procedures, and orders are all accurate and complete.  Gwyneth Sprout, [place] [phone] (phone) [phone] (fax)  [place]

## 2022-06-21 ENCOUNTER — Encounter: Payer: Self-pay | Admitting: Family Medicine

## 2022-06-21 ENCOUNTER — Ambulatory Visit (INDEPENDENT_AMBULATORY_CARE_PROVIDER_SITE_OTHER): Payer: BLUE CROSS/BLUE SHIELD | Admitting: Family Medicine

## 2022-06-21 VITALS — BP 128/96 | HR 88 | Resp 16 | Ht 69.0 in | Wt 211.0 lb

## 2022-06-21 DIAGNOSIS — R030 Elevated blood-pressure reading, without diagnosis of hypertension: Secondary | ICD-10-CM | POA: Insufficient documentation

## 2022-06-21 DIAGNOSIS — Z1159 Encounter for screening for other viral diseases: Secondary | ICD-10-CM | POA: Insufficient documentation

## 2022-06-21 DIAGNOSIS — Z0000 Encounter for general adult medical examination without abnormal findings: Secondary | ICD-10-CM

## 2022-06-21 DIAGNOSIS — R739 Hyperglycemia, unspecified: Secondary | ICD-10-CM | POA: Diagnosis not present

## 2022-06-21 NOTE — Assessment & Plan Note (Signed)
Due for dental Due for vision Has been working swinging shifts on the road Congratulated on quitting smoking for 10 months Things to do to keep yourself healthy  - Exercise at least 30-45 minutes a day, 3-4 days a week.  - Eat a low-fat diet with lots of fruits and vegetables, up to 7-9 servings per day.  - Seatbelts can save your life. Wear them always.  - Smoke detectors on every level of your home, check batteries every year.  - Eye Doctor - have an eye exam every 1-2 years  - Safe sex - if you may be exposed to STDs, use a condom.  - Alcohol -  If you drink, do it moderately, less than 2 drinks per day.  - [place]. Choose someone to speak for you if you are not able.  - Depression is common in our stressful world.If you're feeling down or losing interest in things you normally enjoy, please come in for a visit.  - Violence - If anyone is threatening or hurting you, please call immediately.

## 2022-06-21 NOTE — Assessment & Plan Note (Signed)
Low risk screen Treatable, and curable. If left untreated Hep C can lead to cirrhosis and liver failure. Encourage routine testing; recommend repeat testing if risk factors change.  

## 2022-06-21 NOTE — Assessment & Plan Note (Signed)
Declines medication at this time Recommend diuretic to assist as well as diet/exercise like DASH diet and increased purposeful walking

## 2022-06-21 NOTE — Assessment & Plan Note (Signed)
Check A1c given weight gain and hx of elevated serum glucose

## 2022-06-22 LAB — CBC WITH DIFFERENTIAL/PLATELET
Basophils Absolute: 0 10*3/uL (ref 0.0–0.2)
Basos: 1 %
EOS (ABSOLUTE): 0.2 10*3/uL (ref 0.0–0.4)
Eos: 3 %
Hematocrit: 48.8 % (ref 37.5–51.0)
Hemoglobin: 16.4 g/dL (ref 13.0–17.7)
Immature Grans (Abs): 0 10*3/uL (ref 0.0–0.1)
Immature Granulocytes: 0 %
Lymphocytes Absolute: 2.2 10*3/uL (ref 0.7–3.1)
Lymphs: 35 %
MCH: 29.2 pg (ref 26.6–33.0)
MCHC: 33.6 g/dL (ref 31.5–35.7)
MCV: 87 fL (ref 79–97)
Monocytes Absolute: 0.4 10*3/uL (ref 0.1–0.9)
Monocytes: 7 %
Neutrophils Absolute: 3.5 10*3/uL (ref 1.4–7.0)
Neutrophils: 54 %
Platelets: 299 10*3/uL (ref 150–450)
RBC: 5.61 x10E6/uL (ref 4.14–5.80)
RDW: 12.5 % (ref 11.6–15.4)
WBC: 6.4 10*3/uL (ref 3.4–10.8)

## 2022-06-22 LAB — TSH+FREE T4
Free T4: 1.22 ng/dL (ref 0.82–1.77)
TSH: 2.25 u[IU]/mL (ref 0.450–4.500)

## 2022-06-22 LAB — COMPREHENSIVE METABOLIC PANEL
ALT: 55 IU/L — ABNORMAL HIGH (ref 0–44)
AST: 27 IU/L (ref 0–40)
Albumin/Globulin Ratio: 1.7 (ref 1.2–2.2)
Albumin: 4.5 g/dL (ref 4.1–5.1)
Alkaline Phosphatase: 163 IU/L — ABNORMAL HIGH (ref 44–121)
BUN/Creatinine Ratio: 11 (ref 9–20)
BUN: 13 mg/dL (ref 6–20)
Bilirubin Total: 0.6 mg/dL (ref 0.0–1.2)
CO2: 26 mmol/L (ref 20–29)
Calcium: 9.8 mg/dL (ref 8.7–10.2)
Chloride: 102 mmol/L (ref 96–106)
Creatinine, Ser: 1.14 mg/dL (ref 0.76–1.27)
Globulin, Total: 2.7 g/dL (ref 1.5–4.5)
Glucose: 97 mg/dL (ref 70–99)
Potassium: 4.1 mmol/L (ref 3.5–5.2)
Sodium: 142 mmol/L (ref 134–144)
Total Protein: 7.2 g/dL (ref 6.0–8.5)
eGFR: 87 mL/min/{1.73_m2} (ref >59)

## 2022-06-22 LAB — HEMOGLOBIN A1C
Est. average glucose Bld gHb Est-mCnc: 120 mg/dL
Hgb A1c MFr Bld: 5.8 % — ABNORMAL HIGH (ref 4.8–5.6)

## 2022-06-22 LAB — LIPID PANEL
Chol/HDL Ratio: 3.3 ratio (ref 0.0–5.0)
Cholesterol, Total: 142 mg/dL (ref 100–199)
HDL: 43 mg/dL (ref >39)
LDL Chol Calc (NIH): 68 mg/dL (ref 0–99)
Triglycerides: 187 mg/dL — ABNORMAL HIGH (ref 0–149)
VLDL Cholesterol Cal: 31 mg/dL (ref 5–40)

## 2022-06-22 LAB — HEPATITIS C ANTIBODY: Hep C Virus Ab: NONREACTIVE

## 2022-06-23 NOTE — Progress Notes (Signed)
Chemistry continues to show elevated liver enzymes. I recommend diet low in saturated fat and regular exercise - 30 min at least 5 times per week  Fats remain elevated in cholesterol panel.   A1c shows pre-diabetes. Continue to recommend balanced, lower carb meals. Smaller meal size, adding snacks. Choosing water as drink of choice and increasing purposeful exercise.  All other labs are normal and stable.  Gwyneth Sprout, [place] [address] [place], [place] [address] [phone] (phone) [phone] (fax) [place]

## 2022-08-03 ENCOUNTER — Telehealth: Payer: Self-pay | Admitting: Family Medicine

## 2022-08-03 DIAGNOSIS — J45909 Unspecified asthma, uncomplicated: Secondary | ICD-10-CM

## 2022-08-05 NOTE — Telephone Encounter (Signed)
  Patient states he would like this request for albuterol (VENTOLIN HFA) 108 (90 Base) MCG/ACT inhaler expedited. Patient was last seen 06/21/2022     [facility] [address], [place] - [address] Phone: [phone]  Fax: [phone]

## 2022-08-05 NOTE — Telephone Encounter (Signed)
Requested Prescriptions  Pending Prescriptions Disp Refills   albuterol (VENTOLIN HFA) 108 (90 Base) MCG/ACT inhaler [Pharmacy Med Name: Albuterol Sulfate HFA 108 (90 Base) MCG/ACT Inhalation Aerosol Solution] 8 g 3    Sig: INHALE 2 PUFFS BY MOUTH EVERY 6 HOURS AS NEEDED FOR WHEEZING FOR SHORTNESS OF BREATH     Pulmonology:  Beta Agonists 2 Failed - 08/05/2022  9:33 AM      Failed - Last BP in normal range    BP Readings from Last 1 Encounters:  06/21/22 (!) 128/96         Passed - Last Heart Rate in normal range    Pulse Readings from Last 1 Encounters:  06/21/22 88         Passed - Valid encounter within last 12 months    Recent Outpatient Visits           1 month ago Annual physical exam   [facility] Jacky Kindle, FNP   10 months ago Sore throat   [facility] Malva Limes, [place]   1 year ago Annual physical exam   [facility] Jacky Kindle, FNP   2 years ago Annual physical exam   [facility] Trey Sailors, [place]   3 years ago Annual physical exam   [facility] [place], Lavella Hammock, [place]       Future Appointments             In 10 months Jacky Kindle, FNP [facility], [facility]

## 2023-03-19 ENCOUNTER — Other Ambulatory Visit: Payer: Self-pay | Admitting: Family Medicine

## 2023-03-19 DIAGNOSIS — J45909 Unspecified asthma, uncomplicated: Secondary | ICD-10-CM

## 2023-03-19 NOTE — Telephone Encounter (Signed)
Requested Prescriptions  Pending Prescriptions Disp Refills   albuterol (VENTOLIN HFA) 108 (90 Base) MCG/ACT inhaler [Pharmacy Med Name: Albuterol Sulfate HFA 108 (90 Base) MCG/ACT Inhalation Aerosol Solution] 8 g 0    Sig: INHALE 2 PUFFS BY MOUTH EVERY 6 HOURS AS NEEDED FOR WHEEZING OR SHORTNESS OF BREATH     Pulmonology:  Beta Agonists 2 Failed - 03/19/2023  9:31 AM      Failed - Last BP in normal range    BP Readings from Last 1 Encounters:  06/21/22 (!) 128/96         Failed - Valid encounter within last 12 months    Recent Outpatient Visits           9 months ago Annual physical exam   [facility] Jacky Kindle, FNP   1 year ago Sore throat   [facility] Malva Limes, MD   1 year ago Annual physical exam   [facility] Jacky Kindle, FNP   2 years ago Annual physical exam   [facility] Trey Sailors, [place]   4 years ago Annual physical exam   [facility] Trey Sailors, [place]       Future Appointments             In 3 months Jacky Kindle, FNP [facility]            Passed - Last Heart Rate in normal range    Pulse Readings from Last 1 Encounters:  06/21/22 88

## 2023-05-15 ENCOUNTER — Other Ambulatory Visit: Payer: Self-pay | Admitting: Family Medicine

## 2023-05-15 DIAGNOSIS — J45909 Unspecified asthma, uncomplicated: Secondary | ICD-10-CM

## 2023-05-15 NOTE — Telephone Encounter (Signed)
Medication Refill - Medication: albuterol (VENTOLIN HFA) 108 (90 Base) MCG/ACT inhaler   Has the patient contacted their pharmacy? No.   Preferred Pharmacy (with phone number or street name):  [facility] [address] [place], [place] - [address] Phone: [phone]  Fax: [phone]      Has the patient been seen for an appointment in the last year OR does the patient have an upcoming appointment? Yes.    Please assist patient further

## 2023-05-16 MED ORDER — ALBUTEROL SULFATE HFA 108 (90 BASE) MCG/ACT IN AERS: 2.0000 | INHALATION_SPRAY | Freq: Four times a day (QID) | RESPIRATORY_TRACT | 0 refills | Status: DC | PRN

## 2023-05-16 NOTE — Telephone Encounter (Signed)
Requested Prescriptions  Pending Prescriptions Disp Refills   albuterol (VENTOLIN HFA) 108 (90 Base) MCG/ACT inhaler 8 g 0    Sig: Inhale 2 puffs into the lungs every 6 (six) hours as needed for wheezing or shortness of breath.     Pulmonology:  Beta Agonists 2 Failed - 05/16/2023  8:30 AM      Failed - Last BP in normal range    BP Readings from Last 1 Encounters:  06/21/22 (!) 128/96         Failed - Valid encounter within last 12 months    Recent Outpatient Visits           10 months ago Annual physical exam   [facility] Jacky Kindle, FNP   1 year ago Sore throat   [facility] Malva Limes, MD   1 year ago Annual physical exam   [facility] Jacky Kindle, FNP   2 years ago Annual physical exam   [facility] Trey Sailors, [place]   4 years ago Annual physical exam   [facility] Trey Sailors, [place]       Future Appointments             In 1 month Jacky Kindle, FNP [facility]            Passed - Last Heart Rate in normal range    Pulse Readings from Last 1 Encounters:  06/21/22 88

## 2023-06-27 ENCOUNTER — Encounter: Payer: BLUE CROSS/BLUE SHIELD | Admitting: Family Medicine

## 2023-07-03 ENCOUNTER — Ambulatory Visit (INDEPENDENT_AMBULATORY_CARE_PROVIDER_SITE_OTHER): Payer: BLUE CROSS/BLUE SHIELD | Admitting: Family Medicine

## 2023-07-03 ENCOUNTER — Encounter: Payer: Self-pay | Admitting: Family Medicine

## 2023-07-03 VITALS — BP 136/84 | HR 98 | Ht 68.0 in | Wt 217.0 lb

## 2023-07-03 DIAGNOSIS — R030 Elevated blood-pressure reading, without diagnosis of hypertension: Secondary | ICD-10-CM

## 2023-07-03 DIAGNOSIS — I10 Essential (primary) hypertension: Secondary | ICD-10-CM

## 2023-07-03 DIAGNOSIS — K08199 Complete loss of teeth due to other specified cause, unspecified class: Secondary | ICD-10-CM | POA: Diagnosis not present

## 2023-07-03 DIAGNOSIS — J45909 Unspecified asthma, uncomplicated: Secondary | ICD-10-CM

## 2023-07-03 DIAGNOSIS — Z0000 Encounter for general adult medical examination without abnormal findings: Secondary | ICD-10-CM

## 2023-07-03 DIAGNOSIS — J4520 Mild intermittent asthma, uncomplicated: Secondary | ICD-10-CM | POA: Diagnosis not present

## 2023-07-03 DIAGNOSIS — R7303 Prediabetes: Secondary | ICD-10-CM | POA: Diagnosis not present

## 2023-07-03 DIAGNOSIS — R739 Hyperglycemia, unspecified: Secondary | ICD-10-CM

## 2023-07-03 MED ORDER — ALBUTEROL SULFATE HFA 108 (90 BASE) MCG/ACT IN AERS: 2.0000 | INHALATION_SPRAY | Freq: Four times a day (QID) | RESPIRATORY_TRACT | 11 refills | Status: DC | PRN

## 2023-07-04 ENCOUNTER — Encounter: Payer: BLUE CROSS/BLUE SHIELD | Admitting: Family Medicine

## 2023-07-04 ENCOUNTER — Other Ambulatory Visit: Payer: Self-pay | Admitting: Family Medicine

## 2023-07-04 DIAGNOSIS — K08199 Complete loss of teeth due to other specified cause, unspecified class: Secondary | ICD-10-CM | POA: Insufficient documentation

## 2023-07-04 DIAGNOSIS — I10 Essential (primary) hypertension: Secondary | ICD-10-CM | POA: Insufficient documentation

## 2023-07-04 DIAGNOSIS — R7303 Prediabetes: Secondary | ICD-10-CM | POA: Insufficient documentation

## 2023-07-04 DIAGNOSIS — J4520 Mild intermittent asthma, uncomplicated: Secondary | ICD-10-CM | POA: Insufficient documentation

## 2023-07-04 DIAGNOSIS — K760 Fatty (change of) liver, not elsewhere classified: Secondary | ICD-10-CM

## 2023-07-04 LAB — TSH: TSH: 1.01 u[IU]/mL (ref 0.450–4.500)

## 2023-07-04 LAB — COMPREHENSIVE METABOLIC PANEL
ALT: 65 [IU]/L — ABNORMAL HIGH (ref 0–44)
AST: 29 [IU]/L (ref 0–40)
Albumin: 4.3 g/dL (ref 4.1–5.1)
Alkaline Phosphatase: 140 [IU]/L — ABNORMAL HIGH (ref 44–121)
BUN/Creatinine Ratio: 14 (ref 9–20)
BUN: 13 mg/dL (ref 6–20)
Bilirubin Total: 0.8 mg/dL (ref 0.0–1.2)
CO2: 20 mmol/L (ref 20–29)
Calcium: 10 mg/dL (ref 8.7–10.2)
Chloride: 103 mmol/L (ref 96–106)
Creatinine, Ser: 0.92 mg/dL (ref 0.76–1.27)
Globulin, Total: 2.6 g/dL (ref 1.5–4.5)
Glucose: 96 mg/dL (ref 70–99)
Potassium: 4.3 mmol/L (ref 3.5–5.2)
Sodium: 137 mmol/L (ref 134–144)
Total Protein: 6.9 g/dL (ref 6.0–8.5)
eGFR: 111 mL/min/{1.73_m2} (ref >59)

## 2023-07-04 LAB — LIPID PANEL WITH LDL/HDL RATIO
Cholesterol, Total: 138 mg/dL (ref 100–199)
HDL: 40 mg/dL (ref >39)
LDL Chol Calc (NIH): 64 mg/dL (ref 0–99)
LDL/HDL Ratio: 1.6 ratio (ref 0.0–3.6)
Triglycerides: 204 mg/dL — ABNORMAL HIGH (ref 0–149)
VLDL Cholesterol Cal: 34 mg/dL (ref 5–40)

## 2023-07-04 LAB — CBC WITH DIFFERENTIAL/PLATELET
Basophils Absolute: 0 10*3/uL (ref 0.0–0.2)
Basos: 1 %
EOS (ABSOLUTE): 0.3 10*3/uL (ref 0.0–0.4)
Eos: 5 %
Hematocrit: 47.1 % (ref 37.5–51.0)
Hemoglobin: 16 g/dL (ref 13.0–17.7)
Immature Grans (Abs): 0 10*3/uL (ref 0.0–0.1)
Immature Granulocytes: 0 %
Lymphocytes Absolute: 1.6 10*3/uL (ref 0.7–3.1)
Lymphs: 28 %
MCH: 29 pg (ref 26.6–33.0)
MCHC: 34 g/dL (ref 31.5–35.7)
MCV: 86 fL (ref 79–97)
Monocytes Absolute: 0.4 10*3/uL (ref 0.1–0.9)
Monocytes: 7 %
Neutrophils Absolute: 3.5 10*3/uL (ref 1.4–7.0)
Neutrophils: 59 %
Platelets: 287 10*3/uL (ref 150–450)
RBC: 5.51 x10E6/uL (ref 4.14–5.80)
RDW: 13.2 % (ref 11.6–15.4)
WBC: 5.8 10*3/uL (ref 3.4–10.8)

## 2023-07-04 LAB — HEMOGLOBIN A1C
Est. average glucose Bld gHb Est-mCnc: 126 mg/dL
Hgb A1c MFr Bld: 6 % — ABNORMAL HIGH (ref 4.8–5.6)

## 2023-07-04 MED ORDER — FENOFIBRATE 48 MG PO TABS: 48.0000 mg | ORAL_TABLET | Freq: Every day | ORAL | 3 refills | Status: DC

## 2023-07-04 MED ORDER — METFORMIN HCL ER 500 MG PO TB24: 500.0000 mg | ORAL_TABLET | Freq: Every day | ORAL | 3 refills | Status: DC

## 2023-07-04 NOTE — Assessment & Plan Note (Signed)
Chronic, stable Mild without frequent exacerbations Continue SABA prn

## 2023-07-04 NOTE — Progress Notes (Addendum)
Chronic elevated ALT and Alk Phos; consider fibroscan of liver for MASLD assistance.   Fats elevated on cholesterol; worsened from last year. Recommend trial of low dose fenofibrate.  Pre-diabetes has also worsened; consider daily metformin 500 xr.      Complete physical exam   Patient: Kyle Obrien   DOB: March 20, 1986   37 y.o. Male  MRN: [id number] Visit Date: 07/03/2023  Today's healthcare provider: Jacky Kindle, FNP   Chief Complaint  Patient presents with   Annual Exam   Subjective    Kyle Obrien is a 37 y.o. male who presents today for a complete physical exam.  He reports consuming a general diet. The patient does not participate in regular exercise at present. He generally feels fairly well. He reports sleeping fairly well. He does have additional problems to discuss today.   HPI HPI   Pt declined PHQ9/GAD7 Last edited by Shelly Bombard, CMA on 07/03/2023  3:46 PM.      Past Medical History:  Diagnosis Date   Arm fracture, left 2009   Asthma    Past Surgical History:  Procedure Laterality Date   fracture Left 2009   Social History   Socioeconomic History   Marital status: Single    Spouse name: Not on file   Number of children: Not on file   Years of education: Not on file   Highest education level: Not on file  Occupational History   Not on file  Tobacco Use   Smoking status: Former    Current packs/day: 0.00    Types: Cigarettes    Quit date: 09/01/2021    Years since quitting: 1.8   Smokeless tobacco: Never  Vaping Use   Vaping status: Never Used  Substance and Sexual Activity   Alcohol use: No   Drug use: No   Sexual activity: Not on file  Other Topics Concern   Not on file  Social History Narrative   Not on file   Social Determinants of Health   Financial Resource Strain: Not on file  Food Insecurity: Not on file  Transportation Needs: Not on file  Physical Activity: Not on file  Stress: Not on file  Social Connections: Not on  file  Intimate Partner Violence: Not on file   Family Status  Relation Name Status   Mother  Alive   Father  Alive   Sister  Alive  No partnership data on file   Family History  Problem Relation Age of Onset   Healthy Mother    Healthy Father    Healthy Sister    Allergies  Allergen Reactions   Penicillin G Rash and Other (See Comments)    Pt states allergic reaction as infant Pt states allergic reaction as infant Pt states allergic reaction as infant    Patient Care Team: Jacky Kindle, FNP as PCP - General (Family Medicine)   Medications: Outpatient Medications Prior to Visit  Medication Sig   [DISCONTINUED] albuterol (VENTOLIN HFA) 108 (90 Base) MCG/ACT inhaler Inhale 2 puffs into the lungs every 6 (six) hours as needed for wheezing or shortness of breath.   No facility-administered medications prior to visit.    Review of Systems Last CBC Lab Results  Component Value Date   WBC 5.8 07/03/2023   HGB 16.0 07/03/2023   HCT 47.1 07/03/2023   MCV 86 07/03/2023   MCH 29.0 07/03/2023   RDW 13.2 07/03/2023   PLT 287 07/03/2023   Last metabolic panel Lab  Results  Component Value Date   GLUCOSE 96 07/03/2023   NA 137 07/03/2023   K 4.3 07/03/2023   CL 103 07/03/2023   CO2 20 07/03/2023   BUN 13 07/03/2023   CREATININE 0.92 07/03/2023   EGFR 111 07/03/2023   CALCIUM 10.0 07/03/2023   PROT 6.9 07/03/2023   ALBUMIN 4.3 07/03/2023   LABGLOB 2.6 07/03/2023   AGRATIO 1.7 06/21/2022   BILITOT 0.8 07/03/2023   ALKPHOS 140 (H) 07/03/2023   AST 29 07/03/2023   ALT 65 (H) 07/03/2023   ANIONGAP 9 01/18/2020   Last lipids Lab Results  Component Value Date   CHOL 138 07/03/2023   HDL 40 07/03/2023   LDLCALC 64 07/03/2023   TRIG 204 (H) 07/03/2023   CHOLHDL 3.3 06/21/2022   Last hemoglobin A1c Lab Results  Component Value Date   HGBA1C 6.0 (H) 07/03/2023   Last thyroid functions Lab Results  Component Value Date   TSH 1.010 07/03/2023    Objective     BP 136/84 (BP Location: Left Arm, Patient Position: Sitting, Cuff Size: Large)   Pulse 98   Ht 5\' 8"  (1.727 m)   Wt 217 lb (98.4 kg)   SpO2 97%   BMI 32.99 kg/m   BP Readings from Last 3 Encounters:  07/03/23 136/84  06/21/22 (!) 128/96  06/15/21 (!) 134/92   Wt Readings from Last 3 Encounters:  07/03/23 217 lb (98.4 kg)  06/21/22 211 lb (95.7 kg)  06/15/21 194 lb (88 kg)   SpO2 Readings from Last 3 Encounters:  07/03/23 97%  06/21/22 99%  06/15/21 97%   Physical Exam Vitals and nursing note reviewed.  Constitutional:      General: He is awake. He is not in acute distress.    Appearance: Normal appearance. He is well-developed and well-groomed. He is obese. He is not ill-appearing, toxic-appearing or diaphoretic.  HENT:     Head: Normocephalic and atraumatic.     Jaw: There is normal jaw occlusion. No trismus, tenderness, swelling or pain on movement.     Salivary Glands: Right salivary gland is not diffusely enlarged or tender. Left salivary gland is not diffusely enlarged or tender.     Right Ear: Hearing, tympanic membrane, ear canal and external ear normal. There is no impacted cerumen.     Left Ear: Hearing, tympanic membrane, ear canal and external ear normal. There is no impacted cerumen.     Nose: Nose normal. No congestion or rhinorrhea.     Right Turbinates: Not enlarged, swollen or pale.     Left Turbinates: Not enlarged, swollen or pale.     Right Sinus: No maxillary sinus tenderness or frontal sinus tenderness.     Left Sinus: No maxillary sinus tenderness or frontal sinus tenderness.     Mouth/Throat:     Lips: Pink.     Mouth: Mucous membranes are moist. No injury, lacerations, oral lesions or angioedema.     Pharynx: Oropharynx is clear. Uvula midline. No pharyngeal swelling, oropharyngeal exudate or posterior oropharyngeal erythema.     Tonsils: No tonsillar exudate or tonsillar abscesses.     Comments: Recent full dental abstraction; awaiting permanent  dentures. Eyes:     General: Lids are normal. Vision grossly intact. Gaze aligned appropriately.        Right eye: No discharge.        Left eye: No discharge.     Extraocular Movements: Extraocular movements intact.     Conjunctiva/sclera: Conjunctivae normal.  Pupils: Pupils are equal, round, and reactive to light.  Neck:     Thyroid: No thyroid mass, thyromegaly or thyroid tenderness.     Vascular: No carotid bruit.     Trachea: Trachea normal. No tracheal tenderness.  Cardiovascular:     Rate and Rhythm: Normal rate and regular rhythm.     Pulses: Normal pulses.          Carotid pulses are 2+ on the right side and 2+ on the left side.      Radial pulses are 2+ on the right side and 2+ on the left side.       Femoral pulses are 2+ on the right side and 2+ on the left side.      Popliteal pulses are 2+ on the right side and 2+ on the left side.       Dorsalis pedis pulses are 2+ on the right side and 2+ on the left side.       Posterior tibial pulses are 2+ on the right side and 2+ on the left side.     Heart sounds: Normal heart sounds, S1 normal and S2 normal. No murmur heard.    No friction rub. No gallop.  Pulmonary:     Effort: Pulmonary effort is normal. No respiratory distress.     Breath sounds: Normal breath sounds and air entry. No stridor. No wheezing, rhonchi or rales.  Chest:     Chest wall: No tenderness.  Abdominal:     General: Abdomen is flat. Bowel sounds are normal. There is no distension.     Palpations: Abdomen is soft. There is no mass.     Tenderness: There is no abdominal tenderness. There is no guarding or rebound.     Hernia: No hernia is present.  Genitourinary:    Comments: Exam deferred; denies complaints Musculoskeletal:        General: No swelling, tenderness, deformity or signs of injury. Normal range of motion.     Cervical back: Normal range of motion and neck supple. No rigidity or tenderness.     Right lower leg: No edema.     Left lower  leg: No edema.  Lymphadenopathy:     Cervical: No cervical adenopathy.     Right cervical: No superficial, deep or posterior cervical adenopathy.    Left cervical: No superficial, deep or posterior cervical adenopathy.  Skin:    General: Skin is warm and dry.     Capillary Refill: Capillary refill takes less than 2 seconds.     Coloration: Skin is not jaundiced or pale.     Findings: No bruising, erythema, lesion or rash.  Neurological:     General: No focal deficit present.     Mental Status: He is alert and oriented to person, place, and time. Mental status is at baseline.     GCS: GCS eye subscore is 4. GCS verbal subscore is 5. GCS motor subscore is 6.     Sensory: Sensation is intact. No sensory deficit.     Motor: Motor function is intact. No weakness.     Coordination: Coordination is intact.     Gait: Gait is intact.  Psychiatric:        Attention and Perception: Attention and perception normal.        Mood and Affect: Mood and affect normal.        Speech: Speech normal.        Behavior: Behavior normal. Behavior is cooperative.  Thought Content: Thought content normal.        Cognition and Memory: Cognition normal.        Judgment: Judgment normal.       Last depression screening scores    06/21/2022    3:05 PM 06/15/2021    2:50 PM 06/09/2020    2:17 PM  PHQ 2/9 Scores  PHQ - 2 Score 0  0  PHQ- 9 Score 0    Exception Documentation  Patient refusal    Last fall risk screening    07/04/2023    1:51 PM  Fall Risk   Falls in the past year? 0  Injury with Fall? 0   Last Audit-C alcohol use screening    06/21/2022    3:06 PM  Alcohol Use Disorder Test (AUDIT)  1. How often do you have a drink containing alcohol? 1  3. How often do you have six or more drinks on one occasion? 0   A score of 3 or more in women, and 4 or more in men indicates increased risk for alcohol abuse, EXCEPT if all of the points are from question 1   Results for orders placed  or performed in visit on 07/03/23  CBC with Differential/Platelet  Result Value Ref Range   WBC 5.8 3.4 - 10.8 x10E3/uL   RBC 5.51 4.14 - 5.80 x10E6/uL   Hemoglobin 16.0 13.0 - 17.7 g/dL   Hematocrit 95.2 84.1 - 51.0 %   MCV 86 79 - 97 fL   MCH 29.0 26.6 - 33.0 pg   MCHC 34.0 31.5 - 35.7 g/dL   RDW 32.4 40.1 - 02.7 %   Platelets 287 150 - 450 x10E3/uL   Neutrophils 59 Not Estab. %   Lymphs 28 Not Estab. %   Monocytes 7 Not Estab. %   Eos 5 Not Estab. %   Basos 1 Not Estab. %   Neutrophils Absolute 3.5 1.4 - 7.0 x10E3/uL   Lymphocytes Absolute 1.6 0.7 - 3.1 x10E3/uL   Monocytes Absolute 0.4 0.1 - 0.9 x10E3/uL   EOS (ABSOLUTE) 0.3 0.0 - 0.4 x10E3/uL   Basophils Absolute 0.0 0.0 - 0.2 x10E3/uL   Immature Granulocytes 0 Not Estab. %   Immature Grans (Abs) 0.0 0.0 - 0.1 x10E3/uL  Comprehensive metabolic panel  Result Value Ref Range   Glucose 96 70 - 99 mg/dL   BUN 13 6 - 20 mg/dL   Creatinine, Ser 2.53 0.76 - 1.27 mg/dL   eGFR 664 >40 HK/VQQ/5.95   BUN/Creatinine Ratio 14 9 - 20   Sodium 137 134 - 144 mmol/L   Potassium 4.3 3.5 - 5.2 mmol/L   Chloride 103 96 - 106 mmol/L   CO2 20 20 - 29 mmol/L   Calcium 10.0 8.7 - 10.2 mg/dL   Total Protein 6.9 6.0 - 8.5 g/dL   Albumin 4.3 4.1 - 5.1 g/dL   Globulin, Total 2.6 1.5 - 4.5 g/dL   Bilirubin Total 0.8 0.0 - 1.2 mg/dL   Alkaline Phosphatase 140 (H) 44 - 121 IU/L   AST 29 0 - 40 IU/L   ALT 65 (H) 0 - 44 IU/L  Lipid Panel With LDL/HDL Ratio  Result Value Ref Range   Cholesterol, Total 138 100 - 199 mg/dL   Triglycerides 638 (H) 0 - 149 mg/dL   HDL 40 >75 mg/dL   VLDL Cholesterol Cal 34 5 - 40 mg/dL   LDL Chol Calc (NIH) 64 0 - 99 mg/dL   LDL/HDL Ratio 1.6 0.0 -  3.6 ratio  Hemoglobin A1c  Result Value Ref Range   Hgb A1c MFr Bld 6.0 (H) 4.8 - 5.6 %   Est. average glucose Bld gHb Est-mCnc 126 mg/dL  TSH  Result Value Ref Range   TSH 1.010 0.450 - 4.500 uIU/mL    Assessment & Plan    Routine Health Maintenance and  Physical Exam  Exercise Activities and Dietary recommendations  Goals      Quit Smoking        Immunization History  Administered Date(s) Administered   Hepatitis B 06/04/1999, 07/09/1999, 12/03/1999   Tdap 10/15/2017    Health Maintenance  Topic Date Due   COVID-19 Vaccine (1 - 2023-24 season) Never done   INFLUENZA VACCINE  12/01/2023 (Originally 04/03/2023)   DTaP/Tdap/Td (2 - Td or Tdap) 10/16/2027   Hepatitis C Screening  Completed   HIV Screening  Completed   HPV VACCINES  Aged Out    Discussed health benefits of physical activity, and encouraged him to engage in regular exercise appropriate for his age and condition. Problem List Items Addressed This Visit       Cardiovascular and Mediastinum   Primary hypertension   Relevant Orders   CBC with Differential/Platelet (Completed)   Comprehensive metabolic panel (Completed)   TSH (Completed)     Respiratory   Mild intermittent asthma without complication    Chronic, stable Mild without frequent exacerbations Continue SABA prn       Relevant Medications   albuterol (VENTOLIN HFA) 108 (90 Base) MCG/ACT inhaler     Digestive   [place], surgical    Awaiting dentures; "best thing I've ever done"        Other   Annual physical exam - Primary    Things to do to keep yourself healthy  - Exercise at least 30-45 minutes a day, 3-4 days a week.  - Eat a low-fat diet with lots of fruits and vegetables, up to 7-9 servings per day.  - Seatbelts can save your life. Wear them always.  - Smoke detectors on every level of your home, check batteries every year.  - Eye Doctor - have an eye exam every 1-2 years  - Safe sex - if you may be exposed to STDs, use a condom.  - Alcohol -  If you drink, do it moderately, less than 2 drinks per day.  - Health Care Power of Attorney. Choose someone to speak for you if you are not able.  - Depression is common in our stressful world.If you're feeling down or losing interest in  things you normally enjoy, please come in for a visit.  - Violence - If anyone is threatening or hurting you, please call immediately.       Relevant Orders   CBC with Differential/Platelet (Completed)   Comprehensive metabolic panel (Completed)   Lipid Panel With LDL/HDL Ratio (Completed)   TSH (Completed)   Prediabetes    Chronic, stable Continue to recommend balanced, lower carb meals. Smaller meal size, adding snacks. Choosing water as drink of choice and increasing purposeful exercise.       Relevant Orders   Hemoglobin A1c (Completed)   Return in about 1 year (around 07/02/2024) for annual examination.    Leilani Merl, FNP, have reviewed all documentation for this visit. The documentation on 07/04/23 for the exam, diagnosis, procedures, and orders are all accurate and complete.  Jacky Kindle, FNP  [facility] [phone] (phone) [phone] (fax)  [facility]

## 2023-07-04 NOTE — Assessment & Plan Note (Signed)
Chronic, stable Continue to recommend balanced, lower carb meals. Smaller meal size, adding snacks. Choosing water as drink of choice and increasing purposeful exercise.  

## 2023-07-04 NOTE — Assessment & Plan Note (Signed)

## 2023-07-04 NOTE — Assessment & Plan Note (Signed)
Awaiting dentures; "best thing I've ever done"

## 2024-07-09 ENCOUNTER — Encounter: Payer: BLUE CROSS/BLUE SHIELD | Admitting: Family Medicine

## 2024-07-09 ENCOUNTER — Other Ambulatory Visit: Payer: Self-pay | Admitting: Family Medicine

## 2024-07-09 DIAGNOSIS — J4520 Mild intermittent asthma, uncomplicated: Secondary | ICD-10-CM

## 2024-07-09 NOTE — Telephone Encounter (Signed)
 Copied from CRM #[id number]. Topic: Clinical - Medication Refill >> Jul 09, 2024  2:49 PM Montie POUR wrote: Medication:  albuterol  (VENTOLIN  HFA) 108 (90 Base) MCG/ACT inhaler   Has the patient contacted their pharmacy? Yes (Agent: If no, request that the patient contact the pharmacy for the refill. If patient does not wish to contact the pharmacy document the reason why and proceed with request.) (Agent: If yes, when and what did the pharmacy advise?) Pharmacy needs order to refill  This is the patient's preferred pharmacy:  [facility] [address] [place] - [address] [place] [address] Phone: [phone] Fax: [phone]  Is this the correct pharmacy for this prescription? Yes If no, delete pharmacy and type the correct one.   Has the prescription been filled recently? No  Is the patient out of the medication? No  Has the patient been seen for an appointment in the last year OR does the patient have an upcoming appointment? Yes - He has the earliest appointment with Dr. Donzella on 09/07/24. He was a patient of FNP Emilio  Can we respond through MyChart? No  Agent: Please be advised that Rx refills may take up to 3 business days. We ask that you follow-up with your pharmacy.

## 2024-07-13 NOTE — Telephone Encounter (Signed)
 LOV- 07/03/2023 NOV- 09/07/2024 LRF- 07/03/2023 Outpatient Medication Detail   Disp Refills Start End   albuterol  (VENTOLIN  HFA) 108 (90 Base) MCG/ACT inhaler 18 g 11 07/03/2023 --   Sig - Route: Inhale 2 puffs into the lungs every 6 (six) hours as needed for wheezing or shortness of breath. - Inhalation   Sent to pharmacy as: albuterol  (VENTOLIN  HFA) 108 (90 Base) MCG/ACT inhaler   Notes to Pharmacy: Hold for patient request   E-Prescribing Status: Receipt confirmed by pharmacy (07/03/2023  4:02 PM EDT)

## 2024-07-15 ENCOUNTER — Telehealth: Payer: Self-pay

## 2024-07-15 NOTE — Telephone Encounter (Unsigned)
 Copied from CRM #[id number]. Topic: Clinical - Prescription Issue >> Jul 15, 2024 12:46 PM Kyle Obrien wrote: Reason for CRM: patient is low on his albuterol  and he is stating he will start having symptoms of SOB if he does not get this refill. He sent a request a few days ago and still has not got a update.

## 2024-07-17 ENCOUNTER — Other Ambulatory Visit: Payer: Self-pay | Admitting: Family Medicine

## 2024-07-17 DIAGNOSIS — J4520 Mild intermittent asthma, uncomplicated: Secondary | ICD-10-CM

## 2024-07-17 MED ORDER — ALBUTEROL SULFATE HFA 108 (90 BASE) MCG/ACT IN AERS: 2.0000 | INHALATION_SPRAY | Freq: Four times a day (QID) | RESPIRATORY_TRACT | 1 refills | Status: DC | PRN

## 2024-07-23 ENCOUNTER — Telehealth: Payer: Self-pay | Admitting: Family Medicine

## 2024-07-23 NOTE — Telephone Encounter (Signed)
 Pt needs to est care, last ov in 2024 with payne, Kyle Obrien.

## 2024-07-23 NOTE — Telephone Encounter (Signed)
[facility] faxed refill request for the following medications:  albuterol (VENTOLIN HFA) 108 (90 Base) MCG/ACT inhaler   Please advise.

## 2024-09-07 ENCOUNTER — Ambulatory Visit: Admitting: Family Medicine

## 2024-09-07 ENCOUNTER — Encounter: Payer: Self-pay | Admitting: Family Medicine

## 2024-09-07 VITALS — BP 138/94 | HR 97 | Ht 68.0 in | Wt 229.0 lb

## 2024-09-07 DIAGNOSIS — Z0000 Encounter for general adult medical examination without abnormal findings: Secondary | ICD-10-CM

## 2024-09-07 DIAGNOSIS — I10 Essential (primary) hypertension: Secondary | ICD-10-CM | POA: Diagnosis not present

## 2024-09-07 DIAGNOSIS — J4520 Mild intermittent asthma, uncomplicated: Secondary | ICD-10-CM

## 2024-09-07 DIAGNOSIS — K760 Fatty (change of) liver, not elsewhere classified: Secondary | ICD-10-CM

## 2024-09-07 DIAGNOSIS — R7303 Prediabetes: Secondary | ICD-10-CM | POA: Diagnosis not present

## 2024-09-07 MED ORDER — FENOFIBRATE 48 MG PO TABS: 48.0000 mg | ORAL_TABLET | Freq: Every day | ORAL | 3 refills | Status: AC

## 2024-09-07 MED ORDER — METFORMIN HCL ER 500 MG PO TB24: 500.0000 mg | ORAL_TABLET | Freq: Every day | ORAL | 3 refills | Status: AC

## 2024-09-07 MED ORDER — ALBUTEROL SULFATE HFA 108 (90 BASE) MCG/ACT IN AERS: 2.0000 | INHALATION_SPRAY | Freq: Four times a day (QID) | RESPIRATORY_TRACT | 11 refills | Status: AC | PRN

## 2024-09-07 NOTE — Patient Instructions (Signed)
Increase your intake of fresh/frozen fruits and vegetables.  The Mediterranean diet is a great reference for meal ideas.  I strongly encourage you to incorporate exercise into your daily routine (at least 30 minutes most days of the week) and monitor your dietary intake. - I encourage you to measure/weigh your foods/beverages for a few days to get a more accurate idea of what different amounts of things look like on your plate or in your glass.  - Using smaller plates/glasses also helps in that it tricks our minds into thinking we've eaten more than we have. Studies have shown that we eat more when presented with larger plates, even with the same amount of food on them.   - Plan to eat until you are no longer hungry, rather than until you're full.  If you don't normally exercise, start with something simple or more enjoyable. You can plan to walk for your half hour or do something like dancing if you enjoy it.  Build up your activity over time; this will make it more enjoyable and reduce your risk of injury.  Here are some videos which offer a variety of different workout classes with different levels: https://couchtofitness.com/session/203  It is completely free. If a full video is too much for you to do, you can do them in bite-sized chunks (just pausing when needed and resuming later in the day).   Even if these actions don't ultimately lead to weight loss, increasing your activity will still help to reduce your insulin resistance and will improve your cardiovascular health (making heart attack/stroke less likely as you age).

## 2024-09-07 NOTE — Progress Notes (Signed)
 "   Transition of care visit   Patient: Kyle Obrien   DOB: May 12, 1986   38 y.o. Male  MRN: [id number] Visit Date: 09/07/2024  Today's healthcare provider: LAURAINE LOISE BUOY, DO   Chief Complaint  Patient presents with   Transitions Of Care   Hyperglycemia   Hyperlipidemia   Asthma   Subjective    Kyle Obrien is a 39 y.o. male who presents today as a new patient to establish care.   Hyperglycemia Pertinent negatives include no abdominal pain, arthralgias, chest pain, chills, congestion, coughing, fatigue, fever, headaches, joint swelling, myalgias, nausea, rash, vomiting or weakness.  Hyperlipidemia Pertinent negatives include no chest pain, myalgias or shortness of breath.  Asthma There is no cough or shortness of breath. Pertinent negatives include no appetite change, chest pain, ear pain, fever, headaches, myalgias or trouble swallowing. His past medical history is significant for asthma.      Kyle Obrien is a 39 year old male who presents for an annual physical exam.  He has a history of prediabetes with elevated A1c levels noted on multiple occasions. He is not currently on any medication for prediabetes.  He experiences intermittent elevation in liver enzymes but does not provide further details on specific symptoms or treatments related to this issue.  He has asthma and uses an albuterol  inhaler as needed, approximately three times a week, depending on pollen levels and physical activity. He previously used Advair but discontinued it due to lack of effectiveness and cost. He does not take any allergy medications regularly but uses Zyrtec when around dogs due to sneezing.  His blood pressure is usually around 138/89 mmHg, and he has never been on antihypertensive medication.  He has a history of smoking but quit almost five years ago. He consumes one energy drink per day, having reduced from four per day, and acknowledges a history of high energy drink  consumption since his teenage years.  He has dentures that do not fit properly, which he has had for over a year and a half, but reports no discomfort from them.  He has a history of pneumonia as a child but does not recall receiving the pneumonia vaccine. He has not received the flu or COVID vaccines and declines them today.  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 No numbness or tingling except in the previously injured dorsal aspect of the left arm.     Past Medical History:  Diagnosis Date   Arm fracture, left 2009   Asthma    Past Surgical History:  Procedure Laterality Date   fracture Left 2009   Family Status  Relation Name Status   Mother  Alive   Father  Alive   Sister  Alive  No partnership data on file   Family History  Problem Relation Age of Onset   Healthy Mother    Healthy Father    Healthy Sister    Social History   Socioeconomic History   Marital status: Single    Spouse name: Not on file   Number of children: Not on file   Years of education: Not on file   Highest education level: Not on file  Occupational History   Not on file  Tobacco Use   Smoking status: Former    Current packs/day: 0.00    Average packs/day: 2.0 packs/day    Types: Cigarettes  Quit date: 09/01/2021    Years since quitting: 3.0   Smokeless tobacco: Never  Vaping Use   Vaping status: Never Used  Substance and Sexual Activity   Alcohol use: No   Drug use: No   Sexual activity: Not on file  Other Topics Concern   Not on file  Social History Narrative   Not on file   Social Drivers of Health   Tobacco Use: Medium Risk (09/07/2024)   Patient History    Smoking Tobacco Use: Former    Smokeless Tobacco Use: Never    Passive Exposure: Not on Actuary Strain: Not on file  Food Insecurity: Not on file  Transportation Needs: Not on file  Physical Activity: Not on  file  Stress: Not on file  Social Connections: Not on file  Depression (PHQ2-9): Low Risk (06/21/2022)   Depression (PHQ2-9)    PHQ-2 Score: 0  Alcohol Screen: Not on file  Housing: Not on file  Utilities: Not on file  Health Literacy: Not on file   Show/hide medication list[1] Allergies[2]  Immunization History  Administered Date(s) Administered   Hepatitis B 06/04/1999, 07/09/1999, 12/03/1999   Tdap 10/15/2017    Health Maintenance  Topic Date Due   Influenza Vaccine  11/30/2024 (Originally 04/02/2024)   COVID-19 Vaccine (1 - 2025-26 season) 05/02/2025 (Originally 05/03/2024)   Pneumococcal Vaccine (1 of 2 - PCV) 09/07/2025 (Originally 09/10/2006)   HPV VACCINES (1 - 3-dose SCDM series) 09/07/2025 (Originally 09/10/2014)   DTaP/Tdap/Td (2 - Td or Tdap) 10/16/2027   Hepatitis B Vaccines 19-59 Average Risk  Completed   Hepatitis C Screening  Completed   HIV Screening  Completed   Meningococcal B Vaccine  Aged Out    Patient Care Team: Shyloh Krinke, Lauraine SAILOR, DO as PCP - General (Family Medicine)  Review of Systems  Constitutional:  Negative for appetite change, chills, fatigue and fever.  HENT:  Negative for congestion, ear pain, hearing loss, nosebleeds and trouble swallowing.   Eyes:  Negative for pain and visual disturbance.  Respiratory:  Negative for cough, chest tightness and shortness of breath.   Cardiovascular:  Negative for chest pain, palpitations and leg swelling.  Gastrointestinal:  Negative for abdominal pain, blood in stool, constipation, diarrhea, nausea and vomiting.  Endocrine: Negative for polydipsia, polyphagia and polyuria.  Genitourinary:  Negative for dysuria and flank pain.  Musculoskeletal:  Negative for arthralgias, back pain, joint swelling, myalgias and neck stiffness.  Skin:  Negative for color change, rash and wound.  Neurological:  Negative for dizziness, tremors, seizures, speech difficulty, weakness, light-headedness and headaches.   Psychiatric/Behavioral:  Negative for behavioral problems, confusion, decreased concentration, dysphoric mood and sleep disturbance. The patient is not nervous/anxious.   All other systems reviewed and are negative.       Objective    BP (!) 138/94 (BP Location: Right Arm, Patient Position: Sitting, Cuff Size: Large)   Pulse 97   Ht 5' 8 (1.727 m)   Wt 229 lb (103.9 kg)   SpO2 98%   BMI 34.82 kg/m     Physical Exam Vitals and nursing note reviewed.  Constitutional:      General: He is awake.     Appearance: Normal appearance. He is obese.  HENT:     Head: Normocephalic and atraumatic.     Right Ear: Tympanic membrane, ear canal and external ear normal.     Left Ear: Tympanic membrane, ear canal and external ear normal.     Nose: Nose normal.  Mouth/Throat:     Mouth: Mucous membranes are moist.     Dentition: Abnormal dentition (edentulous).     Pharynx: Oropharynx is clear. No oropharyngeal exudate or posterior oropharyngeal erythema.  Eyes:     General: No scleral icterus.    Extraocular Movements: Extraocular movements intact.     Conjunctiva/sclera: Conjunctivae normal.     Pupils: Pupils are equal, round, and reactive to light.  Neck:     Thyroid: No thyromegaly or thyroid tenderness.  Cardiovascular:     Rate and Rhythm: Normal rate and regular rhythm.     Pulses: Normal pulses.     Heart sounds: Normal heart sounds.  Pulmonary:     Effort: Pulmonary effort is normal. No tachypnea, bradypnea or respiratory distress.     Breath sounds: Normal breath sounds. No stridor. No wheezing, rhonchi or rales.  Abdominal:     General: Bowel sounds are normal. There is no distension.     Palpations: Abdomen is soft. There is no mass.     Tenderness: There is no abdominal tenderness. There is no guarding.     Hernia: No hernia is present.  Musculoskeletal:     Cervical back: Normal range of motion and neck supple.     Right lower leg: No edema.     Left lower leg:  No edema.  Lymphadenopathy:     Cervical: No cervical adenopathy.  Skin:    General: Skin is warm and dry.         Comments: Scar from previous left arm surgery.  Neurological:     Mental Status: He is alert and oriented to person, place, and time. Mental status is at baseline.  Psychiatric:        Mood and Affect: Mood normal.        Behavior: Behavior normal.     Depression Screen    09/07/2024    2:52 PM 06/21/2022    3:05 PM 06/15/2021    2:50 PM 06/09/2020    2:17 PM  PHQ 2/9 Scores  PHQ - 2 Score  0  0  PHQ- 9 Score  0     Exception Documentation Patient refusal  Patient refusal      Data saved with a previous flowsheet row definition   No results found for any visits on 09/07/24.  Assessment & Plan     Annual physical exam  Prediabetes -     Hemoglobin A1c -     Lipid Panel With LDL/HDL Ratio -     metFORMIN  HCl ER; Take 1 tablet (500 mg total) by mouth daily with breakfast.  Dispense: 90 tablet; Refill: 3  Primary hypertension -     CBC with Differential/Platelet -     Comprehensive metabolic panel with GFR -     Lipid Panel With LDL/HDL Ratio  Metabolic dysfunction-associated steatotic liver disease (MASLD) -     Fenofibrate ; Take 1 tablet (48 mg total) by mouth daily.  Dispense: 90 tablet; Refill: 3  Mild intermittent asthma without complication -     Albuterol  Sulfate HFA; Inhale 2 puffs into the lungs every 6 (six) hours as needed for wheezing or shortness of breath.  Dispense: 18 g; Refill: 11    Annual physical exam Routine wellness visit focused on lifestyle modifications for health improvement.  Physical exam overall unremarkable except as noted above. Routine lab work ordered as noted, which patient will have drawn at the end of March.  - Recommended 150 minutes/week of moderate exercise. -  Encouraged portion control and mindful eating. - Discussed exercise benefits for anxiety and weight management. - Recommended 'Couch to Fitness' exercise  videos.  Prediabetes Elevated A1c indicating prediabetes. Discussed lifestyle modifications to improve insulin resistance and prevent diabetes progression. - Ordered blood work for A1c and other parameters. - Scheduled follow-up for blood work in late March.  Primary hypertension Borderline hypertension. Discussed medications versus lifestyle modifications for management, as well as potential combination of both. - Continue lifestyle modifications including exercise and dietary changes. - Consider antihypertensive medication if lifestyle changes insufficient after 6 months.  Metabolic dysfunction-associated steatotic liver disease (MASLD) Elevated liver enzymes possibly related to lifestyle factors. Discussed lifestyle modifications for liver health improvement. - Continue lifestyle modifications including exercise and dietary changes.  Mild intermittent asthma without complication Asthma managed with albuterol  as needed. Increased use during allergy season and physical exertion. Maintenance inhalers were ineffective and costly. - Prescribed albuterol  inhaler with 11 refills. - Recommended Zyrtec nightly during allergy season to reduce potential for exacerbations and need for albuterol .    Return in about 6 months (around 03/07/2025) for Weight, HTN w/next provider.     I discussed the assessment and treatment plan with the patient  The patient was provided an opportunity to ask questions and all were answered. The patient agreed with the plan and demonstrated an understanding of the instructions.   The patient was advised to call back or seek an in-person evaluation if the symptoms worsen or if the condition fails to improve as anticipated.    LAURAINE LOISE BUOY, DO  [facility] [phone] (phone) [phone] (fax)  [facility]    [1]  Outpatient Medications Prior to Visit  Medication Sig   [DISCONTINUED] albuterol  (VENTOLIN  HFA) 108  (90 Base) MCG/ACT inhaler Inhale 2 puffs into the lungs every 6 (six) hours as needed for wheezing or shortness of breath.   [DISCONTINUED] fenofibrate  (TRICOR ) 48 MG tablet Take 1 tablet (48 mg total) by mouth daily.   [DISCONTINUED] metFORMIN  (GLUCOPHAGE -XR) 500 MG 24 hr tablet Take 1 tablet (500 mg total) by mouth daily with breakfast.   No facility-administered medications prior to visit.  [2]  Allergies Allergen Reactions   Penicillin G Rash and Other (See Comments)    Pt states allergic reaction as infant Pt states allergic reaction as infant Pt states allergic reaction as infant   "

## 2025-03-07 ENCOUNTER — Encounter: Admitting: Physician Assistant
